# Patient Record
Sex: Male | Born: 1990 | Race: Black or African American | Hispanic: No | Marital: Single | Smoking: Never smoker
Health system: Southern US, Community
[De-identification: ages and names within clinical notes are randomized; demographics above are authoritative.]

## PROBLEM LIST (undated history)

## (undated) DIAGNOSIS — R569 Unspecified convulsions: Secondary | ICD-10-CM

## (undated) HISTORY — DX: Unspecified convulsions: R56.9

## (undated) HISTORY — PX: CIRCUMCISION: SUR203

---
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
## 2009-01-26 ENCOUNTER — Encounter: Admission: RE | Admit: 2009-01-26 | Discharge: 2009-03-22 | Payer: Self-pay | Admitting: Family Medicine

## 2009-02-21 ENCOUNTER — Emergency Department (HOSPITAL_COMMUNITY): Admission: EM | Admit: 2009-02-21 | Discharge: 2009-02-21 | Payer: Self-pay | Admitting: Emergency Medicine

## 2009-12-08 DIAGNOSIS — G40909 Epilepsy, unspecified, not intractable, without status epilepticus: Secondary | ICD-10-CM | POA: Insufficient documentation

## 2010-07-13 LAB — VALPROIC ACID LEVEL: Valproic Acid Lvl: 93.3 ug/mL (ref 50.0–100.0)
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
## 2010-08-26 NOTE — H&P (Signed)
[place]  Patient:    Casey Bryant Visit Number: [id number] MRN: [id number]          Service Type: PED Location: PEDS [id number] Attending Physician:  Mick Sell Dictated by:   Deanna Artis. Sharene Skeans, M.D. Admit Date:  04/30/2001   CC:         Casey Bryant, M.D.   History and Physical  DATE OF BIRTH: Aug 25, 1990  CHIEF COMPLAINT: Casey Bryant is a 20 year old right-handed African-American male, with history of undifferentiated autism and mental retardation, and a history of complex partial seizures with secondary generalization, that had been in very good control.  HISTORY OF PRESENT ILLNESS: The patient has had infrequent seizures over the last four years.  The last one that I am aware of was in April 2002.  His last clinic visit was in September 2002.  The patient had gained 17.7 pounds between December 2000 and May 2002. Somehow he lost 17 pounds over a four month period, from Aug 15, 2000 to January 04, 2001.  Even if he was misweighed, it was very clear that his weight was under some stable control and that our plans to switch him from Depakote to Lamictal were not necessary.  Today, Casey Bryant was witnessed to have a seizure while at school.  He was about to eat lunch when the seizure began.  He fell sideways out of the chair and struck the floor with his head on the right side.  It was generalized tonoclonic jerking that lasted for two minutes.  He bit his lip and in the aftermath was confused.  He crawled rather than walked.  He was brought to [name], where he became combative and was sedated with 2 mg of Ativan, in part to decrease the level of his agitation and activity.  Evaluation of the patient showed a valproic acid level of less than 0.1 mcg/ml.  His last known drug level in 1998 at 75 mcg/ml.  Casey Bryant is difficult from a perspective of blood drawing because of his size, his  inability to understand why things are being done to him, and his tendency to resist being held down, as well as the painful stimuli.  His caretaker, his foster mother, says that he has been getting his medication on a regular basis.  That leaves either noncompliance with medical regimen or a laboratory error as the principal causes for what appears to be a negligible level of Depakote.  The drug level has been repeated by the laboratory and remains the same, suggesting noncompliance.  PAST MEDICAL HISTORY: As noted above.  I do not recall when the patient had onset of his seizures.  The summary note from March 25, 1997 has not been sent from my office.  The patient had an EEG June 08, 1998 when he was [age] years of age.  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  No seizure activity was seen in the record.  REVIEW OF SYSTEMS: The patient has been physically well, without significant intercurrent infections in the head, neck, lungs, GI, GU.  No rash, anemia, bruisability, diabetes, or thyroid disease.  No new neurologic or neuropsychiatric problems.  Review Of Systems otherwise negative.  PAST SURGICAL HISTORY: None.  CURRENT MEDICATIONS: Depakote 125 mg sprinkles, one p.o. b.i.d.  ALLERGIES: None known.  BIRTH HISTORY: The patient was born at [redacted] weeks gestational age, weighing 2 pounds 16 ounces, and  was in intensive care for about six weeks, on a ventilator for two weeks, and sent home on an apnea bradycardia monitor for four months.  EEG August 1997 was nonspecifically slow.  He had a genetic study for fragile X, negative.  I do not believe that he had seizures in the nursery and believe that likely onset of his seizures was sometime near August 1997 when he had his EEG.  The patient has basically been on Depakote sprinkles in increasing doses since he was young.  Major side effect was obesity but, as I  mentioned, that seems to have improved.  FAMILY HISTORY: The patients brother has autism without seizures.  SOCIAL HISTORY: The patient attends [name].  He is in a foster home at this time.  He was living with his aunt, but I think that that arrangement has changed.  His father lives in town, I do not know how often he sees Casey Bryant.  PHYSICAL EXAMINATION:  GENERAL: On examination today this is a postictal child, sitting on the edge of his stretcher, being held by his caretaker.  VITAL SIGNS: Temperature 97.9 degrees axillary, blood pressure 129/71 resting, pulse 94, respirations 22.  O2 saturation 99% on room air.  HEENT: No signs of infection in tympanic membranes, pharynx, or conjunctivae.  NECK: Supple.  No bruits.  LUNGS: Clear.  HEART: No murmurs.  Pulses normal.  ABDOMEN: Soft, protuberant.  Bowel sounds normal.  No hepatosplenomegaly.  EXTREMITIES: No edema or cyanosis, or altered tone.  NEUROLOGIC: Mental status, autistic, retarded, groaning; unable to follow commands.  Cranial nerves - round, reactive pupils.  Fundi show positive red reflex.  Patient does not fix or follow, nor does he provide eye contact.  He has what appears to be symmetric facial strength and midline tongue.  Motor examination shows basically normal strength.  This is a powerfully built young man.  I cannot easily assess his fine motor movements.  Sensory examination, withdrawal x4.  Gait is ataxic but I cannot easily assess because of his postictal state and the use of Ativan to help decrease his extreme agitation from postictal confusion.  Deep tendon reflexes are absent.  He had bilateral flexor and plantar responses.  IMPRESSION:  1. Breakthrough seizures, secondarily generalized, 345.10.  2. Absent drug level, which suggests problems of noncompliance.  3. Autistic child, with no one to stay with him.  PLAN:  1. IV Depacon 500 mg for three doses at six hour intervals -  1600, 2200, and      0400 hours.  Check level in the morning and change to five sprinkles p.o.     b.i.d.  2. One-to-one nursing tonight so that he can be supervised and will not need     to be restrained.  LABORATORY DATA: Other review of his laboratories shows sodium of 140, potassium 3.8, chloride 108, CO2 20, creatinine 12, BUN 1.0, glucose 124. Hematocrit 45.  Valproic acid level was less than 0.1 mcg/ml.  I appreciate the opportunity to see Casey Bryant and will continue to follow him during the hospitalization.  I hope that the patient remains seizure-free and that drug levels will come up so that he can be discharged in the morning. Dictated by:   Deanna Artis. Sharene Skeans, M.D. Attending Physician:  Mick Sell DD:  04/30/01 TD:  05/01/01 Job: [id number]

## 2010-12-16 DIAGNOSIS — Z79899 Other long term (current) drug therapy: Secondary | ICD-10-CM | POA: Insufficient documentation

## 2011-01-03 LAB — CBC
Hemoglobin: 14.6
Platelets: 139 — ABNORMAL LOW
RDW: 12.9
WBC: 8.5

## 2011-01-03 LAB — POCT I-STAT, CHEM 8
BUN: 17
Calcium, Ion: 1.16
Chloride: 105
Hemoglobin: 15
Potassium: 3.7
Sodium: 141
TCO2: 26

## 2011-01-03 LAB — URINALYSIS, ROUTINE W REFLEX MICROSCOPIC
Hgb urine dipstick: NEGATIVE
Ketones, ur: NEGATIVE
Nitrite: NEGATIVE
Urobilinogen, UA: 1

## 2011-01-03 LAB — DIFFERENTIAL
Eosinophils Relative: 0
Lymphs Abs: 2
Monocytes Absolute: 0.6
Neutro Abs: 6
Neutrophils Relative %: 70

## 2011-01-03 LAB — URINE MICROSCOPIC-ADD ON

## 2011-01-05 LAB — POCT I-STAT 3, VENOUS BLOOD GAS (G3P V)
Acid-Base Excess: 4 — ABNORMAL HIGH
Bicarbonate: 27.1 — ABNORMAL HIGH
O2 Saturation: 94
Operator id: 272551
TCO2: 28
pH, Ven: 7.493 — ABNORMAL HIGH
pO2, Ven: 65 — ABNORMAL HIGH

## 2011-01-05 LAB — VALPROIC ACID LEVEL: Valproic Acid Lvl: 106.1 — ABNORMAL HIGH

## 2011-01-05 LAB — COMPREHENSIVE METABOLIC PANEL
Albumin: 3.7
GFR calc Af Amer: >60
GFR calc non Af Amer: >60
Potassium: 3.6
Sodium: 137
Total Bilirubin: 1.2

## 2011-01-05 LAB — DIFFERENTIAL
Basophils Relative: 0
Eosinophils Absolute: 0
Lymphocytes Relative: 8 — ABNORMAL LOW
Neutrophils Relative %: 92 — ABNORMAL HIGH

## 2011-01-05 LAB — CBC
HCT: 46.6
Hemoglobin: 16.2
MCV: 91.8
RBC: 5.08
WBC: 18.8 — ABNORMAL HIGH

## 2011-07-17 DIAGNOSIS — B079 Viral wart, unspecified: Secondary | ICD-10-CM | POA: Diagnosis not present

## 2011-08-31 DIAGNOSIS — Z79899 Other long term (current) drug therapy: Secondary | ICD-10-CM | POA: Diagnosis not present

## 2011-08-31 DIAGNOSIS — G4700 Insomnia, unspecified: Secondary | ICD-10-CM | POA: Diagnosis not present

## 2011-08-31 DIAGNOSIS — G40309 Generalized idiopathic epilepsy and epileptic syndromes, not intractable, without status epilepticus: Secondary | ICD-10-CM | POA: Diagnosis not present

## 2011-08-31 DIAGNOSIS — F840 Autistic disorder: Secondary | ICD-10-CM | POA: Diagnosis not present

## 2012-07-23 DIAGNOSIS — Z833 Family history of diabetes mellitus: Secondary | ICD-10-CM | POA: Insufficient documentation

## 2012-07-23 DIAGNOSIS — Z79899 Other long term (current) drug therapy: Secondary | ICD-10-CM | POA: Diagnosis not present

## 2012-07-23 DIAGNOSIS — F8189 Other developmental disorders of scholastic skills: Secondary | ICD-10-CM | POA: Diagnosis not present

## 2012-07-23 DIAGNOSIS — G40909 Epilepsy, unspecified, not intractable, without status epilepticus: Secondary | ICD-10-CM | POA: Diagnosis not present

## 2012-08-08 ENCOUNTER — Encounter: Payer: Self-pay | Admitting: Family

## 2012-08-08 ENCOUNTER — Ambulatory Visit (INDEPENDENT_AMBULATORY_CARE_PROVIDER_SITE_OTHER): Payer: Medicare Other | Admitting: Family

## 2012-08-08 VITALS — BP 128/70 | HR 78 | Ht 66.0 in | Wt 167.4 lb

## 2012-08-08 DIAGNOSIS — G4700 Insomnia, unspecified: Secondary | ICD-10-CM

## 2012-08-08 DIAGNOSIS — G40309 Generalized idiopathic epilepsy and epileptic syndromes, not intractable, without status epilepticus: Secondary | ICD-10-CM

## 2012-08-08 DIAGNOSIS — Z79899 Other long term (current) drug therapy: Secondary | ICD-10-CM

## 2012-08-08 DIAGNOSIS — F840 Autistic disorder: Secondary | ICD-10-CM

## 2012-08-08 MED ORDER — LEVETIRACETAM 100 MG/ML PO SOLN: ORAL | Status: DC

## 2012-08-08 MED ORDER — LAMOTRIGINE 100 MG PO TABS: ORAL_TABLET | ORAL | Status: DC

## 2012-08-08 MED ORDER — DEPAKOTE SPRINKLES 125 MG PO CPSP: ORAL_CAPSULE | ORAL | Status: DC

## 2012-08-08 NOTE — Progress Notes (Signed)
Patient: Casey Bryant MRN: [id number] Sex: male DOB: Jan 08, 1991  Provider: Elveria Rising, NP Location of Care: [facility]  Note type: Routine return visit  History of Present Illness: Referral Source: [facility] History from: foster moster Chief Complaint: Seizure Disorder and Autism  Casey Bryant is a 22 y.o. male with history of autism and seizure disorder. He is taking and tolerating Lamotrogine, Depakote and Levetiracetam. His last seizure occurred in November, 2010.  Mom reports that he has been healthy since last seen. He has had problems with poor appetite in the past but that has resolved. He has had problems with insomnia but that has also improved. He is enrolled in a day program called "[employer]". There are no problems with behavior.   Review of Systems: 12 system review was unremarkable  Past Medical History  Diagnosis Date  . Seizures    Hospitalizations: no, Head Injury: no, Nervous System Infections: no, Immunizations up to date: yes Past Medical History Comments: autism and seizures. He has a twin brother who also has autism.   Behavior History attention difficulties and poor social interaction  Surgical History No past surgical history on file. Surgeries: no  Family History family history includes Cancer in his mother. Family History is negative migraines, seizures, cognitive impairment, blindness, deafness, birth defects, chromosomal disorder, autism.  Social History History   Social History  . Marital Status: Single    Spouse Name: N/A    Number of Children: N/A  . Years of Education: N/A   Social History Main Topics  . Smoking status: Never Smoker   . Smokeless tobacco: Not on file  . Alcohol Use: No  . Drug Use: No  . Sexually Active: Not on file   Other Topics Concern  . Not on file   Social History Narrative  . No narrative on file   Educational level  School Attending: N/A   Occupation:  N/A Living with Malen Gauze parents and twin brother.   No current outpatient prescriptions on file prior to visit.   No current facility-administered medications on file prior to visit.   The medication list was reviewed and reconciled. All changes or newly prescribed medications were explained.  A complete medication list was provided to the patient/caregiver.  No Known Allergies  Physical Exam BP 128/70  Pulse 78  Ht 5\' 6"  (1.676 m)  Wt 167 lb 6.4 oz (75.932 kg)  BMI 27.03 kg/m2 Mental Status: Awake and fully alert. He has no language. He responded appropriately to some basic commands and tolerated invasion into his space fairly well. He smiled responsively at times. Cranial Nerves: Fundoscopic exam reveals red reflex. Unable to fully visual fundus due to lack of patient's cooperation. Pupils equal, briskly reactive to light.  Extraocular movements appear full. Tracks objects in his visual field. Turns to localize sounds. Neck flexion and extension normal. Motor: Unable to fully assess but appears to have normal bulk, tone and strength. Sensory: Withdrawal x 4. Coordination: Unable to fully assess but transferred a toy from hand to hand and demonstrated fairly good fine motor movements with his hands.  Gait and Station: Arises from chair without difficulty.  Stance is wide based.  He walked easily but was hesitant to walk without person assistance leading him to do so.  Reflexes: Unable to fully assess but DTR's appear to be absent bilaterally.   Assessment and Plan Casey Bryant is a 22 year old young young man with history of autism and  seizure disorder. His last seizure occurred in November 2010 and he is tolerating his medications well. I will change his Lamotrigine from 25mg  4 tablets twice per day to Lamotrigine 100mg  1 tablet twice per day to simplify administration. He will otherwise continue his medications without change and will return for follow up in one year or sooner if needed.

## 2012-08-08 NOTE — Patient Instructions (Addendum)
Change Lamotrigine 25mg  4 tablets twice per day to Lamotrigine 100mg  1 tablet twice per day.  Continue Depakote Sprinkles and Levetiracetam without change.  Call me if Casey Bryant has seizures or if you have other concerns.  Plan to return for follow up in 1 year or sooner if needed.

## 2012-08-15 ENCOUNTER — Other Ambulatory Visit: Payer: Self-pay

## 2012-08-15 DIAGNOSIS — G40309 Generalized idiopathic epilepsy and epileptic syndromes, not intractable, without status epilepticus: Secondary | ICD-10-CM

## 2012-08-15 MED ORDER — LEVETIRACETAM 100 MG/ML PO SOLN: ORAL | Status: DC

## 2012-08-15 NOTE — Telephone Encounter (Signed)
I called [facility] and they said they never received this Rx but did get the others you sent on the same pt.

## 2012-10-17 ENCOUNTER — Other Ambulatory Visit: Payer: Self-pay

## 2012-10-17 DIAGNOSIS — G40309 Generalized idiopathic epilepsy and epileptic syndromes, not intractable, without status epilepticus: Secondary | ICD-10-CM

## 2012-10-17 MED ORDER — DEPAKOTE SPRINKLES 125 MG PO CPSP: ORAL_CAPSULE | ORAL | Status: DC

## 2012-11-11 ENCOUNTER — Other Ambulatory Visit: Payer: Self-pay

## 2012-11-11 DIAGNOSIS — G40309 Generalized idiopathic epilepsy and epileptic syndromes, not intractable, without status epilepticus: Secondary | ICD-10-CM

## 2012-11-11 MED ORDER — LAMOTRIGINE 100 MG PO TABS: ORAL_TABLET | ORAL | Status: DC

## 2013-03-27 ENCOUNTER — Encounter (HOSPITAL_COMMUNITY): Payer: Self-pay | Admitting: Emergency Medicine

## 2013-03-27 ENCOUNTER — Emergency Department (HOSPITAL_COMMUNITY)
Admission: EM | Admit: 2013-03-27 | Discharge: 2013-03-27 | Disposition: A | Discharge status: Home / Self Care | Payer: Medicare Other | Attending: Emergency Medicine | Admitting: Emergency Medicine

## 2013-03-27 DIAGNOSIS — G40909 Epilepsy, unspecified, not intractable, without status epilepticus: Secondary | ICD-10-CM | POA: Diagnosis not present

## 2013-03-27 DIAGNOSIS — Y9389 Activity, other specified: Secondary | ICD-10-CM | POA: Insufficient documentation

## 2013-03-27 DIAGNOSIS — T426X1A Poisoning by other antiepileptic and sedative-hypnotic drugs, accidental (unintentional), initial encounter: Secondary | ICD-10-CM | POA: Insufficient documentation

## 2013-03-27 DIAGNOSIS — F840 Autistic disorder: Secondary | ICD-10-CM | POA: Diagnosis not present

## 2013-03-27 DIAGNOSIS — Z79899 Other long term (current) drug therapy: Secondary | ICD-10-CM | POA: Insufficient documentation

## 2013-03-27 DIAGNOSIS — T50901A Poisoning by unspecified drugs, medicaments and biological substances, accidental (unintentional), initial encounter: Secondary | ICD-10-CM | POA: Diagnosis not present

## 2013-03-27 DIAGNOSIS — Y9289 Other specified places as the place of occurrence of the external cause: Secondary | ICD-10-CM | POA: Insufficient documentation
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
## 2013-03-27 NOTE — ED Provider Notes (Signed)
CSN: [id number]     Arrival date & time 03/27/13  1342 History   First MD Initiated Contact with Patient 03/27/13 1620     Chief Complaint  Patient presents with  . Drug Overdose   (Consider location/radiation/quality/duration/timing/severity/associated sxs/prior Treatment) HPI Comments: Level V caveat due to 2 autism. Patient also has a known seizure disorder. He used to be on Lamictal 25 mg tablets and he was instructed on his MAR to be given 4 tablets in the morning and in the evening for a total dosage of 200 mg per day. He apparently received a new prescription of 100 mg tablets, and caregivers were still giving him 4 tablets in the morning and in the evening since Sunday. None of the lamictal was given to him today. Yesterday at his activity center, it was noted that the patient was acting drowsy and "drunk." He is acting more like his baseline today coincide and with holding his medications. He also had 2 episodes of nausea and vomiting early this morning at 2 AM, however the patient has subsequently eaten some breakfast and lunch without apparently any difficulty.  Patient is a 22 y.o. male presenting with Overdose. The history is provided by a caregiver. The history is limited by a developmental delay.  Drug Overdose    Past Medical History  Diagnosis Date  . Seizures    History reviewed. No pertinent past surgical history. Family History  Problem Relation Age of Onset  . Cancer Mother     Died in 22-Aug-2002   History  Substance Use Topics  . Smoking status: Never Smoker   . Smokeless tobacco: Not on file  . Alcohol Use: No    Review of Systems  Unable to perform ROS: Other    Allergies  Review of patient's allergies indicates no known allergies.  Home Medications   Current Outpatient Rx  Name  Route  Sig  Dispense  Refill  . DEPAKOTE SPRINKLES 125 MG capsule      Take 5 sprinkles in the morning and 6 in the evening (BMN)   333 capsule   11     Dispense as  written.    Brand Name Medically Necessary   . lamoTRIgine (LAMICTAL) 100 MG tablet      Take 1 in the morning and 1 at night   60 tablet   8   . levETIRAcetam (KEPPRA) 100 MG/ML solution      Take 7.5 cc in the morning and 7.5 cc in the evening   475 mL   11   . Multiple Vitamins-Minerals (MULTIVITAMIN WITH MINERALS) tablet   Oral   Take 1 tablet by mouth daily.          BP 122/86  Pulse 95  Resp 18  Wt 184 lb 2 oz (83.519 kg)  SpO2 97% Physical Exam  Nursing note and vitals reviewed. Constitutional: He appears well-developed and well-nourished. No distress.  HENT:  Head: Normocephalic and atraumatic.  Cardiovascular: Intact distal pulses.   Pulmonary/Chest: Effort normal. No respiratory distress.  Abdominal: Soft. He exhibits no distension. There is no tenderness.  Neurological: He is alert. He displays no tremor. He exhibits normal muscle tone.  Awake, interactive, looks arounnd puts head down to look at floor when I address him, but then will look up again afterwards.  Skin: Skin is warm. He is not diaphoretic.  Psychiatric:  Normal behavior for his baseline per caregiver    ED Course  Procedures (including critical care time) Labs  Review Labs Reviewed - No data to display Imaging Review No results found.  EKG Interpretation    Date/Time:  Thursday March 27 2013 17:11:08 EST Ventricular Rate:  96 PR Interval:  133 QRS Duration: 85 QT Interval:  345 QTC Calculation: 436 R Axis:   49 Text Interpretation:  Sinus rhythm Borderline T wave abnormalities Borderline ECG Confirmed by [facility]  MD, MICHEAL ([id number]) on 03/27/2013 5:13:44 PM           RA sat is 97% and I interpret to be adequate  5:27 PM Spoke to poison control in [place] (covering for [place] currently) and no specific testing is needed, confirms that half life is rapid, peak levels are past already and I suspect pt is fine to go home, resume meds tomorrow.      MDM   1. Accidental overdose,  initial encounter     Pt is at baseline per caregiver.  She had spoken to poison control and was instructed to come to the ED.  Pt seems shy, but is behaving like he normally would around strangers.     Will discuss with Poison control and follow recommendations.  I spoke to pharmacy who reports short half life, should be cleared from system by tomorrow and resuming correct dose tomorrow would be appropriate.  Pt's improvement in symptoms is consistent with stopping that medicine today.        Gavin Pound. Oletta Lamas, MD 03/27/13 1610

## 2013-03-27 NOTE — Discharge Instructions (Signed)
 Accidental Overdose A drug overdose occurs when a chemical substance (drug or medication) is used in amounts large enough to overcome a person. This may result in severe illness or death. This is a type of poisoning. Accidental overdoses of medications or other substances come from a variety of reasons. When this happens accidentally, it is often because the person taking the substance does not know enough about what they have taken. Drugs which commonly cause overdose deaths are alcohol, psychotropic medications (medications which affect the mind), pain medications, illegal drugs (street drugs) such as cocaine and heroin, and multiple drugs taken at the same time. It may result from careless behavior (such as over-indulging at a party). Other causes of overdose may include multiple drug use, a lapse in memory, or drug use after a period of no drug use.  Sometimes overdosing occurs because a person cannot remember if they have taken their medication.  A common unintentional overdose in young children involves multi-vitamins containing iron. Iron is a part of the hemoglobin molecule in blood. It is used to transport oxygen to living cells. When taken in small amounts, iron allows the body to restock hemoglobin. In large amounts, it causes problems in the body. If this overdose is not treated, it can lead to death. Never take medicines that show signs of tampering or do not seem quite right. Never take medicines in the dark or in poor lighting. Read the label and check each dose of medicine before you take it. When adults are poisoned, it happens most often through carelessness or lack of information. Taking medicines in the dark or taking medicine prescribed for someone else to treat the same type of problem is a dangerous practice. SYMPTOMS  Symptoms of overdose depend on the medication and amount taken. They can vary from over-activity with stimulant over-dosage, to sleepiness from depressants such as  alcohol, narcotics and tranquilizers. Confusion, dizziness, nausea and vomiting may be present. If problems are severe enough coma and death may result. DIAGNOSIS  Diagnosis and management are generally straightforward if the drug is known. Otherwise it is more difficult. At times, certain symptoms and signs exhibited by the patient, or blood tests, can reveal the drug in question.  TREATMENT  In an emergency department, most patients can be treated with supportive measures. Antidotes may be available if there has been an overdose of opioids or benzodiazepines. A rapid improvement will often occur if this is the cause of overdose. At home or away from medical care:  There may be no immediate problems or warning signs in children.  Not everything works well in all cases of poisoning.  Take immediate action. Poisons may act quickly.  If you think someone has swallowed medicine or a household product, and the person is unconscious, having seizures (convulsions), or is not breathing, immediately call for an ambulance. IF a person is conscious and appears to be doing OK but has swallowed a poison:  Do not wait to see what effect the poison will have. Immediately call a poison control center (listed in the white pages of your telephone book under Poison Control or inside the front cover with other emergency numbers). Some poison control centers have TTY capability for the deaf. Check with your local center if you or someone in your family requires this service.  Keep the container so you can read the label on the product for ingredients.  Describe what, when, and how much was taken and the age and condition of the person  poisoned. Inform them if the person is vomiting, choking, drowsy, shows a change in color or temperature of skin, is conscious or unconscious, or is convulsing.  Do not cause vomiting unless instructed by medical personnel. Do not induce vomiting or force liquids into a person who  is convulsing, unconscious, or very drowsy. Stay calm and in control.   Activated charcoal also is sometimes used in certain types of poisoning and you may wish to add a supply to your emergency medicines. It is available without a prescription. Call a poison control center before using this medication. PREVENTION  Thousands of children die every year from unintentional poisoning. This may be from household chemicals, poisoning from carbon monoxide in a car, taking their parent's medications, or simply taking a few iron pills or vitamins with iron. Poisoning comes from unexpected sources.  Store medicines out of the sight and reach of children, preferably in a locked cabinet. Do not keep medications in a food cabinet. Always store your medicines in a secure place. Get rid of expired medications.  If you have children living with you or have them as occasional guests, you should have child-resistant caps on your medicine containers. Keep everything out of reach. Child proof your home.  If you are called to the telephone or to answer the door while you are taking a medicine, take the container with you or put the medicine out of the reach of small children.  Do not take your medication in front of children. Do not tell your child how good a medication is and how good it is for them. They may get the idea it is more of a treat.  If you are an adult and have accidentally taken an overdose, you need to consider how this happened and what can be done to prevent it from happening again. If this was from a street drug or alcohol, determine if there is a problem that needs addressing. If you are not sure a problems exists, it is easy to talk to a professional and ask them if they think you have a problem. It is better to handle this problem in this way before it happens again and has a much worse consequence. Document Released: 06/10/2004 Document Revised: 06/19/2011 Document Reviewed: 11/16/2008 [employer]  Patient Information 2014 [employer]   YOU MAY RESUME THE CORRECT DOSE OF LAMICTAL  100 MG BID STARTING TOMORROW 03/28/13

## 2013-03-27 NOTE — ED Notes (Signed)
Per pt advocate sts that pt has been receiving triple of his psych medication since Sunday. sts since yesterday pt has been acting drunk and not eating.

## 2013-04-29 DIAGNOSIS — Z23 Encounter for immunization: Secondary | ICD-10-CM | POA: Diagnosis not present

## 2013-08-18 ENCOUNTER — Other Ambulatory Visit: Payer: Self-pay | Admitting: Family

## 2013-08-27 ENCOUNTER — Ambulatory Visit: Payer: Self-pay | Admitting: Family

## 2013-09-03 ENCOUNTER — Ambulatory Visit (INDEPENDENT_AMBULATORY_CARE_PROVIDER_SITE_OTHER): Payer: Medicare Other | Admitting: Family

## 2013-09-03 ENCOUNTER — Encounter: Payer: Self-pay | Admitting: Family

## 2013-09-03 VITALS — BP 130/72 | HR 80 | Ht 65.25 in | Wt 186.2 lb

## 2013-09-03 DIAGNOSIS — Z79899 Other long term (current) drug therapy: Secondary | ICD-10-CM

## 2013-09-03 DIAGNOSIS — R635 Abnormal weight gain: Secondary | ICD-10-CM

## 2013-09-03 DIAGNOSIS — G40309 Generalized idiopathic epilepsy and epileptic syndromes, not intractable, without status epilepticus: Secondary | ICD-10-CM

## 2013-09-03 DIAGNOSIS — G4700 Insomnia, unspecified: Secondary | ICD-10-CM

## 2013-09-03 DIAGNOSIS — F840 Autistic disorder: Secondary | ICD-10-CM | POA: Diagnosis not present

## 2013-09-03 MED ORDER — DEPAKOTE SPRINKLES 125 MG PO CPSP: ORAL_CAPSULE | ORAL | Status: DC

## 2013-09-03 MED ORDER — LAMOTRIGINE 100 MG PO TABS: ORAL_TABLET | ORAL | Status: DC

## 2013-09-03 MED ORDER — LEVETIRACETAM 100 MG/ML PO SOLN: ORAL | Status: DC

## 2013-09-03 NOTE — Patient Instructions (Signed)
Continue Casey Bryant's medications without change. Let me know if he has any seizures. I have refilled his medications.  Continue to monitor his intake and snacks. He has gained weight since last year. It would be good for him to lose some weight, as he is considered obese, based on his BMI. Today his BMI is 30, and he should be closer to 25. His BMI last year was 27, when he weighed 167 lbs. I have printed his last 3 weights that we have on file for you to see.  Wt Readings from Last 3 Encounters:  09/03/13 186 lb 3.2 oz (84.46 kg)  03/27/13 184 lb 2 oz (83.519 kg)  08/08/12 167 lb 6.4 oz (75.932 kg)   Please plan to return for follow up in 1 year or sooner if needed.

## 2013-09-03 NOTE — Progress Notes (Signed)
Patient: Casey Bryant MRN: [id number] Sex: male DOB: Sep 09, 1990  Provider: Elveria Rising, NP Location of Care: [facility]  Note type: Routine return visit  History of Present Illness: Referral Source: [facility] History from: foster mother Chief Complaint: Seizures/Autism  Casey Bryant is a 23 y.o. young man with history of autism and seizure disorder. He is taking and tolerating Lamotrogine, Depakote and Levetiracetam. His last seizure occurred in November, 2010. Mom reports that he has been healthy since last seen. He had problems with poor appetite in the past but that resolved. Since he was last seen in March 2015, he now appears to be overweight. Casey Bryant has had problems with insomnia in the past as well but that has also improved. He is enrolled in a day program called "[employer]". There are no problems with behavior for the most part. His foster mother has no concerns today.  Review of Systems: 12 system review was unremarkable  Past Medical History  Diagnosis Date  . Seizures    Hospitalizations: no, Head Injury: no, Nervous System Infections: no, Immunizations up to date: yes Past Medical History Comments: He has a twin brother with autism.  Surgical History History reviewed. No pertinent past surgical history.  Family History family history includes Cancer in his mother. He has a twin brother with autism. Family History is otherwise negative for migraines, seizures, cognitive impairment, blindness, deafness, birth defects, chromosomal disorder.  Social History History   Social History  . Marital Status: Single    Spouse Name: N/A    Number of Children: N/A  . Years of Education: N/A   Social History Main Topics  . Smoking status: Never Smoker   . Smokeless tobacco: Never Used  . Alcohol Use: No  . Drug Use: No  . Sexual Activity: No   Other Topics Concern  . None   Social History Narrative  . None    Educational level: special education  School Attending:[employer] Living with:  Casey Bryant provider  Hobbies/Interest: walking in the park, watching TV and listening to the radio. School comments:  [name] is doing well in his day program.  Physical Exam BP 130/72  Pulse 80  Ht 5' 5.25" (1.657 m)  Wt 186 lb 3.2 oz (84.46 kg)  BMI 30.76 kg/m2 General: well developed, well nourished male, seated, in no evident distress. He had intermittent flapping motions of his hands. Head: head normocephalic and atraumatic.   Ears, Nose and Throat: oropharynx benign Neck: supple with no carotid or supraclavicular bruits. Respiratory: lungs clear to auscultation Cardiovascular: regular rate and rhythm, no murmurs Musculoskeletal: no obvious deformities or scoliosis Skin: no rashes Trunk: normal alignment and mobility, no deformity  Neurologic Exam  Mental Status: Awake and fully alert. He has no language. He responded appropriately to some basic commands and tolerated invasion into his space fairly well. He smiled responsively at times. Cranial Nerves: Fundoscopic exam reveals red reflex. Unable to fully visual fundus due to lack of patient's cooperation. Pupils equal, briskly reactive to light.  Extraocular movements appear full. Tracks objects in his visual field. Turns to localize sounds. Neck flexion and extension normal. Motor: Unable to fully assess but appears to have normal bulk, tone and strength. Sensory: Withdrawal x 4. Coordination: Unable to fully assess but transferred a toy from hand to hand and demonstrated fairly good fine motor movements with his hands.  Gait and Station: Arises from chair without difficulty.  Stance is  wide based.  He walked easily but was hesitant to walk without person assistance leading him to do so.  Reflexes: Unable to fully assess but DTR's appear to be absent bilaterally.   Assessment and Plan Casey Bryant is a 23 year old young man with  history of autism and seizure disorder. He is taking and tolerating Lamotrigine, Levetiracetam and Depakote. His last seizure occurred in November 2010. Casey Bryant has gained almost 20 lbs since his last visit and I talked with his mother about this. I am concerned that with this type of weight gain that he is at risk for conditions related to obesity such as diabetes and hypertension. I talked with her about limiting portion sizes and snacks, offering healthy foods, and limiting foods with empty calories such as soft drinks. Casey Bryant can be picky, and I do not want him to abruptly begin to refuse foods because he doesn't like what is offered. His foster mother agreed and will make small changes that she knows that he will tolerate with his history of autism. He will otherwise continue his medications without change and will return for follow up in one year or sooner if needed.

## 2013-09-05 ENCOUNTER — Encounter: Payer: Self-pay | Admitting: Family

## 2013-09-05 DIAGNOSIS — E663 Overweight: Secondary | ICD-10-CM | POA: Insufficient documentation

## 2013-09-21 ENCOUNTER — Other Ambulatory Visit: Payer: Self-pay | Admitting: Family

## 2013-12-17 DIAGNOSIS — G40909 Epilepsy, unspecified, not intractable, without status epilepticus: Secondary | ICD-10-CM | POA: Diagnosis not present

## 2013-12-17 DIAGNOSIS — F840 Autistic disorder: Secondary | ICD-10-CM | POA: Diagnosis not present

## 2013-12-17 DIAGNOSIS — H6120 Impacted cerumen, unspecified ear: Secondary | ICD-10-CM | POA: Diagnosis not present

## 2013-12-17 DIAGNOSIS — Z79899 Other long term (current) drug therapy: Secondary | ICD-10-CM | POA: Diagnosis not present

## 2014-03-14 ENCOUNTER — Other Ambulatory Visit: Payer: Self-pay | Admitting: Family

## 2014-04-12 ENCOUNTER — Other Ambulatory Visit: Payer: Self-pay | Admitting: Family

## 2014-04-13 ENCOUNTER — Other Ambulatory Visit: Payer: Self-pay | Admitting: Family

## 2014-04-13 MED ORDER — DEPAKOTE SPRINKLES 125 MG PO CPSP: ORAL_CAPSULE | ORAL | Status: DC

## 2014-04-13 NOTE — Telephone Encounter (Signed)
Previous Rx did not print. TG 

## 2014-04-18 ENCOUNTER — Other Ambulatory Visit: Payer: Self-pay | Admitting: Family

## 2014-04-22 ENCOUNTER — Other Ambulatory Visit: Payer: Self-pay | Admitting: Family

## 2014-04-26 ENCOUNTER — Emergency Department (HOSPITAL_COMMUNITY): Payer: Medicare Other

## 2014-04-26 ENCOUNTER — Emergency Department (HOSPITAL_COMMUNITY)
Admission: EM | Admit: 2014-04-26 | Discharge: 2014-04-26 | Disposition: A | Discharge status: Home / Self Care | Payer: Medicare Other | Attending: Emergency Medicine | Admitting: Emergency Medicine

## 2014-04-26 ENCOUNTER — Encounter (HOSPITAL_COMMUNITY): Payer: Self-pay | Admitting: *Deleted

## 2014-04-26 DIAGNOSIS — M25511 Pain in right shoulder: Secondary | ICD-10-CM | POA: Diagnosis not present

## 2014-04-26 DIAGNOSIS — G40909 Epilepsy, unspecified, not intractable, without status epilepticus: Secondary | ICD-10-CM | POA: Insufficient documentation

## 2014-04-26 DIAGNOSIS — S4991XA Unspecified injury of right shoulder and upper arm, initial encounter: Secondary | ICD-10-CM | POA: Diagnosis not present

## 2014-04-26 DIAGNOSIS — R569 Unspecified convulsions: Secondary | ICD-10-CM | POA: Diagnosis present

## 2014-04-26 DIAGNOSIS — Z79899 Other long term (current) drug therapy: Secondary | ICD-10-CM | POA: Diagnosis not present

## 2014-04-26 LAB — CBC WITH DIFFERENTIAL/PLATELET
Basophils Absolute: 0 10*3/uL (ref 0.0–0.1)
Basophils Relative: 0 % (ref 0–1)
Eosinophils Absolute: 0 10*3/uL (ref 0.0–0.7)
Eosinophils Relative: 0 % (ref 0–5)
HCT: 43.1 % (ref 39.0–52.0)
Hemoglobin: 14.9 g/dL (ref 13.0–17.0)
LYMPHS ABS: 1.4 10*3/uL (ref 0.7–4.0)
LYMPHS PCT: 11 % — AB (ref 12–46)
MCH: 31 pg (ref 26.0–34.0)
MCHC: 34.6 g/dL (ref 30.0–36.0)
MCV: 89.8 fL (ref 78.0–100.0)
MONOS PCT: 7 % (ref 3–12)
Monocytes Absolute: 0.9 10*3/uL (ref 0.1–1.0)
Neutro Abs: 10.2 10*3/uL — ABNORMAL HIGH (ref 1.7–7.7)
Neutrophils Relative %: 82 % — ABNORMAL HIGH (ref 43–77)
Platelets: 159 10*3/uL (ref 150–400)
RBC: 4.8 MIL/uL (ref 4.22–5.81)
RDW: 12.5 % (ref 11.5–15.5)
WBC: 12.5 10*3/uL — ABNORMAL HIGH (ref 4.0–10.5)

## 2014-04-26 LAB — COMPREHENSIVE METABOLIC PANEL
ALBUMIN: 4.2 g/dL (ref 3.5–5.2)
ALT: 18 U/L (ref 0–53)
AST: 47 U/L — AB (ref 0–37)
Alkaline Phosphatase: 51 U/L (ref 39–117)
Anion gap: 10 (ref 5–15)
BUN: 9 mg/dL (ref 6–23)
CALCIUM: 10.4 mg/dL (ref 8.4–10.5)
CO2: 30 mmol/L (ref 19–32)
Chloride: 103 mEq/L (ref 96–112)
Creatinine, Ser: 0.99 mg/dL (ref 0.50–1.35)
GFR calc Af Amer: >90 mL/min (ref >90)
Glucose, Bld: 110 mg/dL — ABNORMAL HIGH (ref 70–99)
Potassium: 4 mmol/L (ref 3.5–5.1)
Sodium: 143 mmol/L (ref 135–145)
TOTAL PROTEIN: 7.5 g/dL (ref 6.0–8.3)
Total Bilirubin: 0.6 mg/dL (ref 0.3–1.2)

## 2014-04-26 LAB — VALPROIC ACID LEVEL: Valproic Acid Lvl: 94 ug/mL (ref 50.0–100.0)

## 2014-04-26 MED ORDER — LEVETIRACETAM 100 MG/ML PO SOLN
1000.0000 mg | Freq: Once | ORAL | Status: AC
  Administered 2014-04-26: 1000 mg via ORAL
  Filled 2014-04-26: qty 10

## 2014-04-26 NOTE — ED Notes (Signed)
Pt family concerned about receiving keppra since he had 750 mg PTA. MD aware and spoke with family about med.

## 2014-04-26 NOTE — ED Provider Notes (Signed)
CSN: [id number]     Arrival date & time 04/26/14  1632 History   First MD Initiated Contact with Patient 04/26/14 1653     Chief Complaint  Patient presents with  . Seizures     (Consider location/radiation/quality/duration/timing/severity/associated sxs/prior Treatment) HPI 24 year old male with past medical history of autism and seizure who presents to ED with parents after having a seizure today. Mom states patient missed his dose of Keppra yesterday and this morning because he ran out. She states patient had a witnessed seizure lasting 1-1/2 minutes with generalized shaking typical of previous seizures. Mom states patient has not had a seizure for 5 years as he has been on a strict antiepileptic regimen. States she called patient's neurologist who advised she come to the ED for levels of his Dilantin checked and further evaluation. Mom states patient has not had any preceding illness and denies patient having fever, cough, nausea, vomiting, diarrhea. They state patient is acting normal now.  Past Medical History  Diagnosis Date  . Seizures    History reviewed. No pertinent past surgical history. Family History  Problem Relation Age of Onset  . Cancer Mother     Died in 2004   History  Substance Use Topics  . Smoking status: Never Smoker   . Smokeless tobacco: Never Used  . Alcohol Use: No    Review of Systems Unable to obtain 2/2 autism / MR   Allergies  Review of patient's allergies indicates no known allergies.  Home Medications   Prior to Admission medications   Medication Sig Start Date End Date Taking? Authorizing Provider  divalproex (DEPAKOTE SPRINKLE) 125 MG capsule Take 625-750 mg by mouth 2 (two) times daily. Take 5 capsules (625 mg) every morning and 6 capsules (750 mg) every night   Yes Historical Provider, MD  lamoTRIgine (LAMICTAL) 100 MG tablet TAKE 1 TABLET BY MOUTH IN THE MORNING AND 1 TABLET AT NIGHT 04/20/14  Yes Elveria Risingina Goodpasture, NP  levETIRAcetam  (KEPPRA) 100 MG/ML solution TAKE 1 & 1/2 TEASPOONFULS (7.5 ML ) BY MOUTH 2 TIMES A DAY 04/23/14  Yes Elveria Risingina Goodpasture, NP  Multiple Vitamins-Minerals (MULTIVITAMIN WITH MINERALS) tablet Take 1 tablet by mouth daily.   Yes Historical Provider, MD  DEPAKOTE SPRINKLES 125 MG capsule TAKE 5 CAPSULES EVERY MORNING AND TAKE 6 CAPSULES EVERY EVENING Patient not taking: Reported on 04/26/2014 04/20/14   Elveria Risingina Goodpasture, NP  lamoTRIgine (LAMICTAL) 100 MG tablet TAKE 1 TABLET BY MOUTH IN THE MORNING AND 1 TABLET AT NIGHT Patient not taking: Reported on 04/26/2014 03/16/14   Elveria Risingina Goodpasture, NP   BP 129/76 mmHg  Pulse 79  Temp(Src) 98.1 F (36.7 C) (Oral)  Resp 24  Wt 178 lb (80.74 kg)  SpO2 100% Physical Exam  Constitutional: He appears well-developed and well-nourished. No distress.  HENT:  Head: Normocephalic and atraumatic.  Nose: Nose normal.  Mouth/Throat: Oropharynx is clear and moist. No oropharyngeal exudate.  Eyes: Conjunctivae and EOM are normal.  Neck: Normal range of motion. Neck supple. No JVD present.  Cardiovascular: Normal rate, regular rhythm, normal heart sounds and intact distal pulses.   Pulmonary/Chest: Effort normal and breath sounds normal. No respiratory distress.  Abdominal: Soft. He exhibits no distension. There is no tenderness. There is no rebound and no guarding.  Musculoskeletal: Normal range of motion.  Neurological: He is alert. No cranial nerve deficit.  Reported to be at baseline per parents  Skin: Skin is warm and dry. No rash noted.  Psychiatric: He has a  normal mood and affect.    ED Course  Procedures (including critical care time) Labs Review Labs Reviewed  CBC WITH DIFFERENTIAL - Abnormal; Notable for the following:    WBC 12.5 (*)    Neutrophils Relative % 82 (*)    Neutro Abs 10.2 (*)    Lymphocytes Relative 11 (*)    All other components within normal limits  COMPREHENSIVE METABOLIC PANEL - Abnormal; Notable for the following:    Glucose, Bld  110 (*)    AST 47 (*)    All other components within normal limits  VALPROIC ACID LEVEL    Imaging Review Dg Shoulder Right  04/26/2014   CLINICAL DATA:  Seizure.  Fall.  Right shoulder pain.  EXAM: RIGHT SHOULDER - 2+ VIEW  COMPARISON:  None.  FINDINGS: There is no evidence of fracture or dislocation. There is no evidence of arthropathy or other focal bone abnormality. Soft tissues are unremarkable.  IMPRESSION: Negative.   Electronically Signed   By: Herbie Baltimore M.D.   On: 04/26/2014 18:52     EKG Interpretation None      MDM   Final diagnoses:  None    Patient resistance with seizure typical for prior after missed dose of Keppra. Hemodynamically stable in ED. Parents state patient acting normal now. Patient has had his morning dose of Keppra approximately 2 hours ago. We will check Dilantin level. We will also load patient with Keppra at this time. Dilantin level within normal limits. Have instructed patient is to continue their normal antiepileptic regimen as previously instructed and to follow closely with her neurologist. Shoulder x-ray is without signs of acute injury. Deemed stable for discharge.  Clinical Impression: 1. Seizure     Disposition: Discharge  Condition: Good  I have discussed the results, Dx and Tx plan with the pt(& family if present). He/she/they expressed understanding and agree(s) with the plan. Discharge instructions discussed at great length. Strict return precautions discussed and pt &/or family have verbalized understanding of the instructions. No further questions at time of discharge.    New Prescriptions   No medications on file    Follow Up:    Please follow up with your neurologist in next couple of weeks for recheck.   [facility] EMERGENCY DEPARTMENT [address] [id number] mc [place] [address] [phone]  If symptoms worsen   Pt seen in conjunction with Dr. Audree Camel,  MD  Ames Dura, DO [facility] Emergency Medicine Resident - PGY-2    Ames Dura, MD 04/27/14 0865  Audree Camel, MD 04/30/14 [id number]

## 2014-04-26 NOTE — ED Notes (Signed)
The pt has autiusm and seizures no seizures for 6 years until today  Grand mal..  No tongue damage no incontinence of bowel or bladder.  The pt is non-verbal/  He was out of keppra for 24 hours until the doctor called the med in.  No recent illness.  When he had the seizure he fell and is having some rt shoulder and arm pain  Both parents are with him

## 2014-04-26 NOTE — Discharge Instructions (Signed)

## 2014-04-26 NOTE — ED Notes (Signed)
Pt family states- they were out of his keppra for 24 hours, she (mom) got it filled this morning but was going to hold it until his bedtime dose. Then he had a seizure lasting about a minute and a half, patient was not too drowsy afterwards and has eaten and drunken since then. She gave him his morning dose after the seizure.

## 2014-04-27 ENCOUNTER — Telehealth: Payer: Self-pay | Admitting: Family

## 2014-04-27 NOTE — Telephone Encounter (Signed)
I reviewed your note and agree with your assessment and recommendations to mother. 

## 2014-04-27 NOTE — Telephone Encounter (Signed)
I received a fax from [facility], [facility] because she was having difficulty getting his Rx filled and because [name] had a seizure on 04/26/14 after being out of medication for a couple of days. Mom said that pharmacy told her that they did not have a refill when it was actually sent in by this office on 04/23/14.  She was told that the office had not been responsive to their request for refill. [name] ran out of medication while Mom was trying to work this out. We did not receive a phone call from Mom last week about this problem. The pharmacy sent in refill request on 04/22/14 after 5pm and the refill was approved on 04/23/14 at 9:12AM. Mom said that [name] had a seizure yesterday afternoon after being out of medication since 04/24/14 AM. Mom said that he was seen in the ER and given an extra dose of Levetiracetam, then restarted on same dose. She talked to different pharmacist after his ER visit and learned that the refill had been at pharmacy and ready for pick up since 04/23/14. I talked to Mom and told her that if she had problems like this again to call the office during business hours so that I can call pharmacy directly and work out refill issue. I also told her that nothing new was wrong with Darshawn to cause him to have a seizure, and explained about low medication level in his system. I told her that I would be happy to see him, but that he needed to get back on schedule with his medication and not miss more doses, not change to new medication. She said that was suggested to her by pharmacy and ER. I explained why he did not need new medication and again stressed importance of compliance. She agreed with these instructions and decided against an appointment for now, saying that [name] was doing well since the ER visit. TG

## 2014-07-26 ENCOUNTER — Other Ambulatory Visit: Payer: Self-pay | Admitting: Family

## 2014-08-24 ENCOUNTER — Encounter: Payer: Self-pay | Admitting: Family

## 2014-08-24 ENCOUNTER — Ambulatory Visit (INDEPENDENT_AMBULATORY_CARE_PROVIDER_SITE_OTHER): Payer: Medicare Other | Admitting: Family

## 2014-08-24 VITALS — BP 126/76 | HR 82 | Ht 66.5 in | Wt 170.0 lb

## 2014-08-24 DIAGNOSIS — E663 Overweight: Secondary | ICD-10-CM | POA: Diagnosis not present

## 2014-08-24 DIAGNOSIS — F840 Autistic disorder: Secondary | ICD-10-CM

## 2014-08-24 DIAGNOSIS — G4700 Insomnia, unspecified: Secondary | ICD-10-CM

## 2014-08-24 DIAGNOSIS — G40309 Generalized idiopathic epilepsy and epileptic syndromes, not intractable, without status epilepticus: Secondary | ICD-10-CM

## 2014-08-24 NOTE — Progress Notes (Signed)
Patient: Casey Bryant MRN: [id number] Sex: male DOB: 27-Jul-1990  Provider: Elveria RisingGOODPASTURE, Valeree Leidy, NP [facility]  Note type: Routine return visit  History of Present Illness: Referral Source: [facility]  History from: foster mother Chief Complaint: Seizures/Autism   Casey Bryant is a 24 y.o. young man with history of autism and seizure disorder. He was last seen Sep 03, 2013. Casey Bryant is taking and tolerating Lamotrogine, Depakote and Levetiracetam. His last seizure occurred in January 2016 in the setting of missed medication. Prior to that his last seizure occurred in November 2010.   When Casey Bryant was last seen, he had gained considerable weight. Today he weighs 170 lbs which is a 16 lb weight loss. Mom reports that he is a picky eater and has a variable appetite. Casey Bryant has intermittent problems with insomnia. There are no problems with behavior for the most part.   Casey Bryant's foster mother says that he has been otherwise healthy since last seen and that she has no new concerns about his health.   Review of Systems: Please see the HPI for neurologic and other pertinent review of systems. Otherwise, the following systems are noncontributory including constitutional, eyes, ears, nose and throat, cardiovascular, respiratory, gastrointestinal, genitourinary, musculoskeletal, skin, endocrine, hematologic/lymph, allergic/immunologic and psychiatric.   Past Medical History  Diagnosis Date  . Seizures    Hospitalizations: No., Head Injury: No., Nervous System Infections: No., Immunizations up to date: Yes.   Past Medical History Comments: He has a twin brother with autism and seizures.  Surgical History Past Surgical History  Procedure Laterality Date  . Circumcision  1990    Family History family history includes Cancer in his mother. Family History is otherwise negative for migraines, seizures, cognitive impairment, blindness,  deafness, birth defects, chromosomal disorder, autism.  Social History History   Social History  . Marital Status: Single    Spouse Name: N/A  . Number of Children: N/A  . Years of Education: N/A   Social History Main Topics  . Smoking status: Never Smoker   . Smokeless tobacco: Never Used  . Alcohol Use: No  . Drug Use: No  . Sexual Activity: No   Other Topics Concern  . None   Social History Narrative   Educational level: special education School Attending: [employer]  Living with:  [name] his AFL Provider   Hobbies/Interest: Enjoys music, watching TV and he likes to be alone. School comments:  None   Allergies No Known Allergies  Physical Exam BP 126/76 mmHg  Pulse 82  Ht 5' 6.5" (1.689 m)  Wt 170 lb (77.111 kg)  BMI 27.03 kg/m2   General: well developed, well nourished male, seated, in no evident distress. He had intermittent flapping motions of his hands. Head: head normocephalic and atraumatic.  Ears, Nose and Throat: oropharynx benign Neck: supple with no carotid or supraclavicular bruits. Respiratory: lungs clear to auscultation Cardiovascular: regular rate and rhythm, no murmurs Musculoskeletal: no obvious deformities or scoliosis Skin: no rashes Trunk: normal alignment and mobility, no deformity  Neurologic Exam  Mental Status: Awake and fully alert. He has no language. He responded appropriately to some basic commands and tolerated invasion into his space fairly well. He smiled responsively at times. Cranial Nerves: Fundoscopic exam reveals red reflex. Unable to fully visual fundus due to lack of patient's cooperation. Pupils equal, briskly reactive to light. Extraocular movements appear full. Tracks objects in his visual field. Turns to localize sounds.  Neck flexion and extension normal. Motor: Unable to fully assess but appears to have normal bulk, tone and strength. Sensory: Withdrawal x 4. Coordination: Unable to fully assess but  transferred a toy from hand to hand and demonstrated fairly good fine motor movements with his hands.  Gait and Station: Arises from chair without difficulty. Stance is wide based. He walked easily but was hesitant to walk without coaching from another person.  Reflexes: Unable to fully assess but DTR's appear to be absent bilaterally.  Impression 1. Generalized convulsive epilepsy 2. Autism 3. Insomnia 4. Overweight   Recommendations for plan of care [facility]. Casey Bryant has neither had nor required imaging or lab studies since the last visit. He is a 24 year old young man with history of autism and seizure disorder. He is taking and tolerating Lamotrigine, Levetiracetam and Depakote, and has been seizure free since January 2016, when he had a seizure in the setting of missed medication. He has lost some weight since last year and I talked with his foster mother about that. He is closer now to a normal BMI, and I encouraged her to continue to monitor his intake. He will otherwise continue his medications without change and will return for follow up in one year or sooner if needed.   The medication list was reviewed and reconciled.  No changes were made in the prescribed medications today.  A complete medication list was provided to the his foster mother.  Total time spent with the patient was 20 minutes, of which 50% or more was spent in counseling and coordination of care.

## 2014-08-26 NOTE — Patient Instructions (Signed)
Continue Casey Bryant's medications without change for now. Let me know if he has any seizures.   Continue to monitor his weight. He has lost some weight and now has a BMI of 27.03, which is closer to a normal BMI of 18.5 to 24.9.  I have listed his last 3 weights recorded for your review.   Wt Readings from Last 3 Encounters:  08/24/14 170 lb (77.111 kg)  04/26/14 178 lb (80.74 kg)  09/03/13 186 lb 3.2 oz (84.46 kg)   Please plan to return for follow up in 1 year or sooner if needed.

## 2014-09-14 ENCOUNTER — Other Ambulatory Visit: Payer: Self-pay | Admitting: Family

## 2014-10-13 ENCOUNTER — Other Ambulatory Visit: Payer: Self-pay | Admitting: Family

## 2014-12-22 DIAGNOSIS — Z0000 Encounter for general adult medical examination without abnormal findings: Secondary | ICD-10-CM | POA: Diagnosis not present

## 2014-12-22 DIAGNOSIS — Z23 Encounter for immunization: Secondary | ICD-10-CM | POA: Diagnosis not present

## 2014-12-22 DIAGNOSIS — Z79899 Other long term (current) drug therapy: Secondary | ICD-10-CM | POA: Diagnosis not present

## 2015-03-16 ENCOUNTER — Other Ambulatory Visit: Payer: Self-pay | Admitting: Family

## 2015-04-10 ENCOUNTER — Other Ambulatory Visit: Payer: Self-pay | Admitting: Family

## 2015-04-28 ENCOUNTER — Other Ambulatory Visit: Payer: Self-pay | Admitting: Family

## 2015-05-31 DIAGNOSIS — R58 Hemorrhage, not elsewhere classified: Secondary | ICD-10-CM | POA: Diagnosis not present

## 2015-08-23 DIAGNOSIS — J0100 Acute maxillary sinusitis, unspecified: Secondary | ICD-10-CM | POA: Diagnosis not present

## 2015-08-24 ENCOUNTER — Ambulatory Visit (INDEPENDENT_AMBULATORY_CARE_PROVIDER_SITE_OTHER): Payer: Medicare Other | Admitting: Family

## 2015-08-24 ENCOUNTER — Encounter: Payer: Self-pay | Admitting: Family

## 2015-08-24 VITALS — BP 130/80 | HR 84 | Ht 66.0 in | Wt 167.6 lb

## 2015-08-24 DIAGNOSIS — F840 Autistic disorder: Secondary | ICD-10-CM

## 2015-08-24 DIAGNOSIS — G40309 Generalized idiopathic epilepsy and epileptic syndromes, not intractable, without status epilepticus: Secondary | ICD-10-CM | POA: Diagnosis not present

## 2015-08-24 DIAGNOSIS — E663 Overweight: Secondary | ICD-10-CM

## 2015-08-24 NOTE — Progress Notes (Signed)
Patient: Casey Bryant MRN: [id number] Sex: male DOB: [birth year]  Provider: Elveria Rising, NP Location of Care: [facility]  Note type: Routine return visit  History of Present Illness: Referral Source: [facility] History from: referring office, [facility] chart and foster mother Chief Complaint: Epilepsy, Autism  Casey Bryant is a 25 y.o. young man with history of autism and seizure disorder. He was last seen Aug 24, 2014. Casey Bryant is taking and tolerating Lamotrogine, Depakote and Levetiracetam. His last seizure occurred in January 2016 in the setting of missed medication. Prior to that his last seizure occurred in November 2010.   Casey Bryant has had difficulty in the past with weight gain. Mom reports that he is a picky eater and has a variable appetite. His weight is more appropriate today with a BMI of 27.  Casey Bryant has intermittent problems with insomnia. There are no problems with behavior for the most part.   Casey Bryant has nasal congestion from seasonal allergies today. He is unaware of the nasal discharge and resists slightly when his foster mother cleans his nose.   Casey Bryant's foster mother is concerned because he tends to hold his ear and lean his head to that side. She says that he does it when walking, despite being prompted to stand straight. He has not exhibited ataxia or experienced falls.   Casey Bryant's foster mother says that he has been otherwise healthy since last seen and that she has no new concerns about his health other than previously mentioned.   Review of Systems: Please see the HPI for neurologic and other pertinent review of systems. Otherwise, the following systems are noncontributory including constitutional, eyes, ears, nose and throat, cardiovascular, respiratory, gastrointestinal, genitourinary, musculoskeletal, skin, endocrine, hematologic/lymph, allergic/immunologic and psychiatric.   Past Medical History  Diagnosis Date  . Seizures  (HCC)    Hospitalizations: No., Head Injury: No., Nervous System Infections: No., Immunizations up to date: Yes.   Past Medical History Comments: He has a twin brother with autism and seizures  Surgical History Past Surgical History  Procedure Laterality Date  . Circumcision  1990    Family History family history includes Cancer in his mother. Family History is otherwise negative for migraines, seizures, cognitive impairment, blindness, deafness, birth defects, chromosomal disorder, autism.  Social History Social History   Social History  . Marital Status: Single    Spouse Name: N/A  . Number of Children: N/A  . Years of Education: N/A   Social History Main Topics  . Smoking status: Never Smoker   . Smokeless tobacco: Never Used  . Alcohol Use: No  . Drug Use: No  . Sexual Activity: No   Other Topics Concern  . None   Social History Narrative   His biologic mother is deceased. He lives with foster mother and twin brother, who also has autism and seizures.       Allergies No Known Allergies  Physical Exam BP 130/80 mmHg  Pulse 84  Ht 5\' 6"  (1.676 m)  Wt 167 lb 9.6 oz (76.023 kg)  BMI 27.06 kg/m2 General: well developed, well nourished male, seated, in no evident distress. He had intermittent flapping motions of his hands. Head: head normocephalic and atraumatic.  Ears, Nose and Throat: oropharynx benign Neck: supple with no carotid or supraclavicular bruits. Respiratory: lungs clear to auscultation Cardiovascular: regular rate and rhythm, no murmurs Musculoskeletal: no obvious deformities or scoliosis Skin: no rashes Trunk: normal alignment and mobility, no deformity  Neurologic Exam  Mental Status: Awake and fully alert. He has no language. He responded appropriately to some basic commands and tolerated invasion into his space fairly well. He smiled responsively at times. He is unaware of his significant nasal discharge.  Cranial Nerves: Fundoscopic exam  reveals red reflex. Unable to fully visual fundus due to lack of patient's cooperation. Pupils equal, briskly reactive to light. Extraocular movements appear full. Tracks objects in his visual field. Turns to localize sounds. Neck flexion and extension normal. Motor: Unable to fully assess but appears to have normal bulk, tone and strength. Sensory: Withdrawal x 4. Coordination: Unable to fully assess but transferred a toy from hand to hand and demonstrated fairly good fine motor movements with his hands.  Gait and Station: Arises from chair without difficulty. Stance is wide based. He walked easily but was hesitant to walk without coaching from another person.  Reflexes: Unable to fully assess but DTR's appear to be absent bilaterally.  Impression 1.  Generalized convulsive epilepsy 2. Autism 3. Insomnia 4. History of weight gain   Recommendations for plan of care The patient's previous [facility] records were reviewed. Casey Bryant has neither had nor required imaging or lab studies since the last visit. He is a 25 year old young man with history of autism and seizure disorder. He is taking and tolerating Lamotrigine, Levetiracetam and Depakote, and has been seizure free since January 2016, when he had a seizure in the setting of missed medication. Casey Bryant has had problems with weight gain in the past but his weight is improved with a BMI of 27 today. I encouraged his foster mother to continue to monitor his intake. I talked with his foster mother about her concerns about him holding his ear and leaning his head to one side. His ear examination was normal bilaterally. When walking, he was able to walk with his hands down and his head in midline position, but would revert to holding his ear and leaning his head. This appears to be a behavior that he has simply adopted, rather than an indicator of something wrong, as his foster mother feared. I reassured his foster mother about the behavior and asked her to  continue to prompt him to hold his head in midline and to let me know if he develops any other concerning behaviors. He will otherwise continue his medications without change and will return for follow up in one year or sooner if needed. Mom agreed with the plans made today.  Wt Readings from Last 3 Encounters:  08/24/15 167 lb 9.6 oz (76.023 kg)  08/24/14 170 lb (77.111 kg)  04/26/14 178 lb (80.74 kg)   The medication list was reviewed and reconciled.  No changes were made in the prescribed medications today.  A complete medication list was provided to the patient's foster mother.    Medication List       This list is accurate as of: 08/24/15  9:30 AM.  Always use your most recent med list.               Casey Bryant 875-125 MG tablet  Generic drug:  amoxicillin-clavulanate  Take by mouth.     DEPAKOTE SPRINKLES 125 MG capsule  Generic drug:  divalproex  TAKE 5 CAPSULES BY MOUTH EVERY MORNING AND TAKE 6 CAPSULES EVERY EVENING     lamoTRIgine 100 MG tablet  Commonly known as:  LAMICTAL  TAKE 1 TABLET BY MOUTH IN THE MORNING AND 1 TABLET AT NIGHT     lamoTRIgine 100 MG  tablet  Commonly known as:  LAMICTAL  TAKE 1 TABLET BY MOUTH IN THE MORNING AND 1 TABLET AT NIGHT     levETIRAcetam 100 MG/ML solution  Commonly known as:  KEPPRA  TAKE 1 & 1/2 TEASPOONFULS (7.5 ML ) BY MOUTH 2 TIMES A DAY     multivitamin with minerals tablet  Take 1 tablet by mouth daily.       Total time spent with the patient was 20 minutes, of which 50% or more was spent in counseling and coordination of care.   Elveria Risingina Taneesha Edgin

## 2015-08-25 NOTE — Patient Instructions (Signed)
Continue giving Casey Bryant's medications as you have been giving them.   Let me know if he has any seizures or any behavior suspicious for seizures.   Please plan to return for follow up in 1 year or sooner if needed.

## 2015-09-06 IMAGING — DX DG SHOULDER 2+V*R*
3 series · 3 of 3 positions shown · non-contrast
Comparison: None.

CLINICAL DATA: Seizure.  Fall.  Right shoulder pain.

EXAM:
RIGHT SHOULDER - 2+ VIEW

[shoulder grashey]
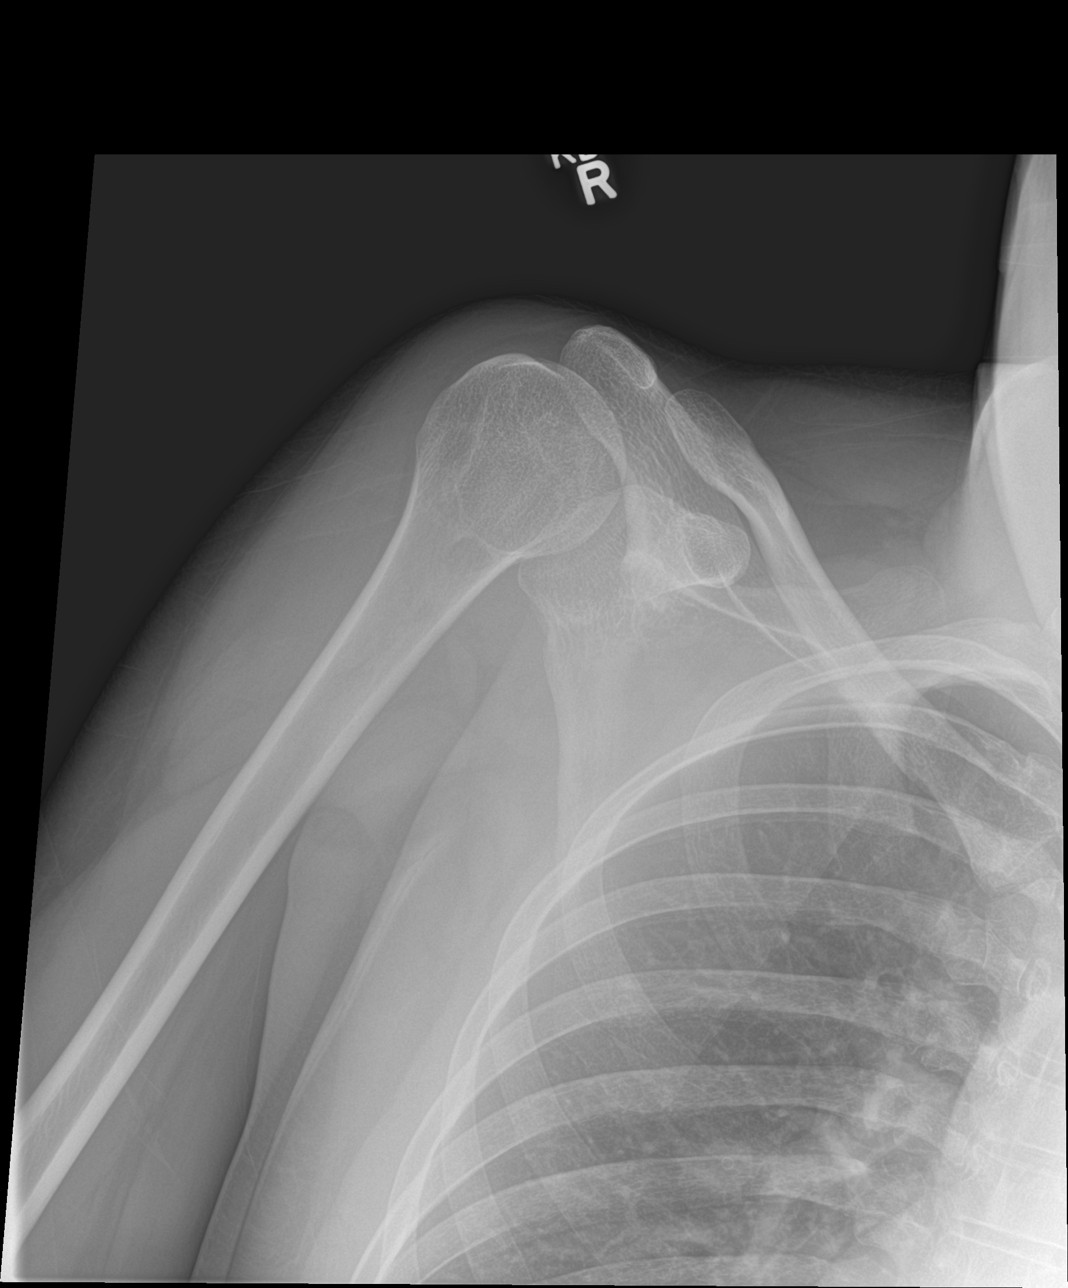

[shoulder y view]
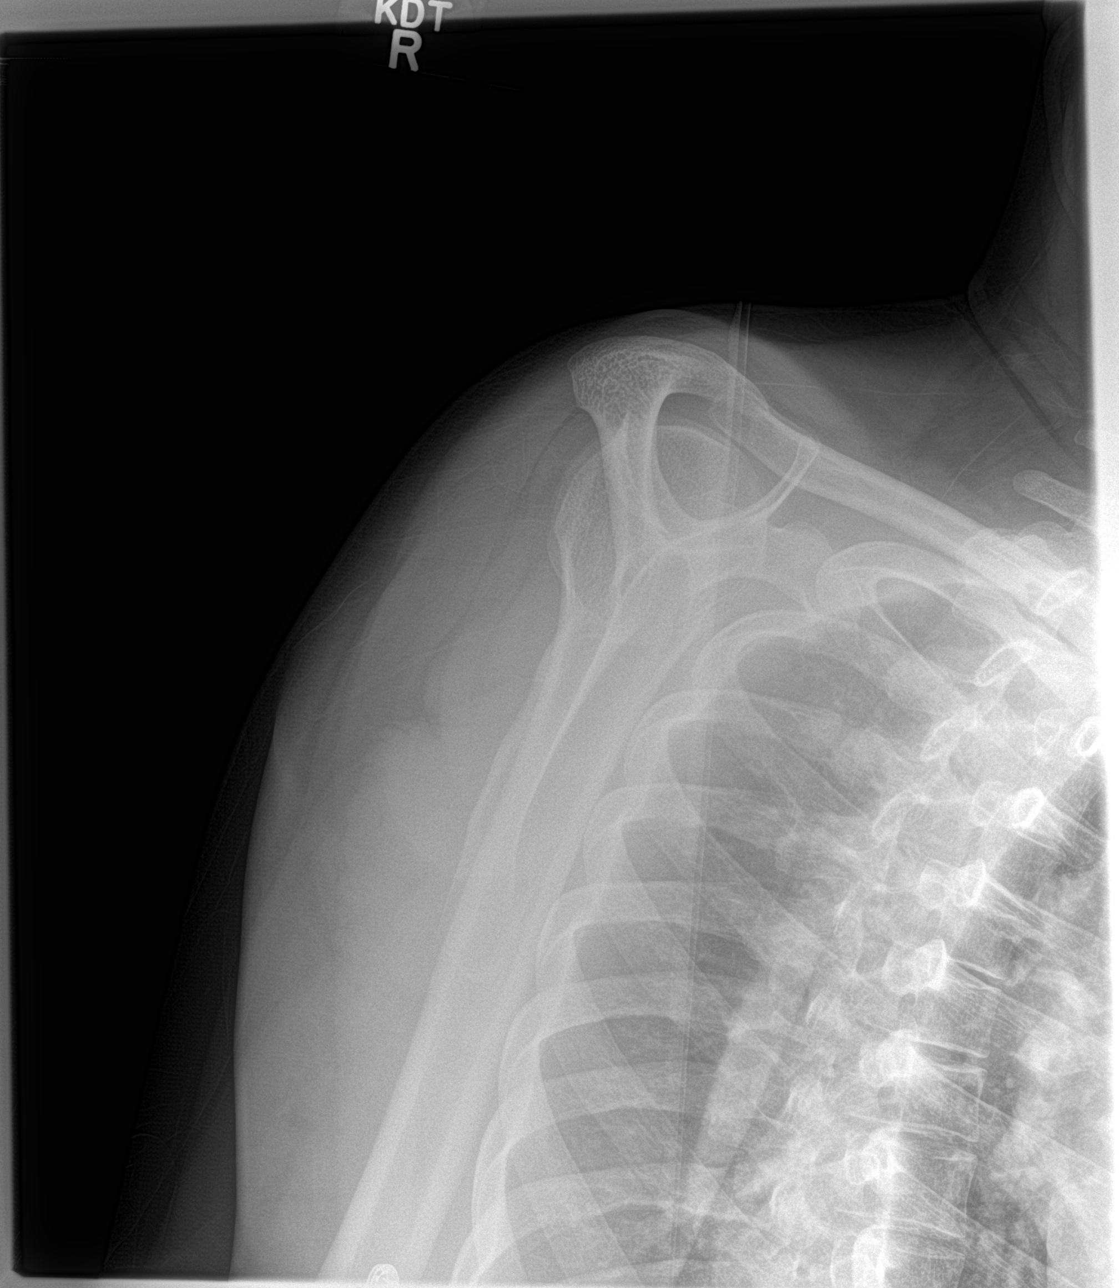

[shoulder axillary]
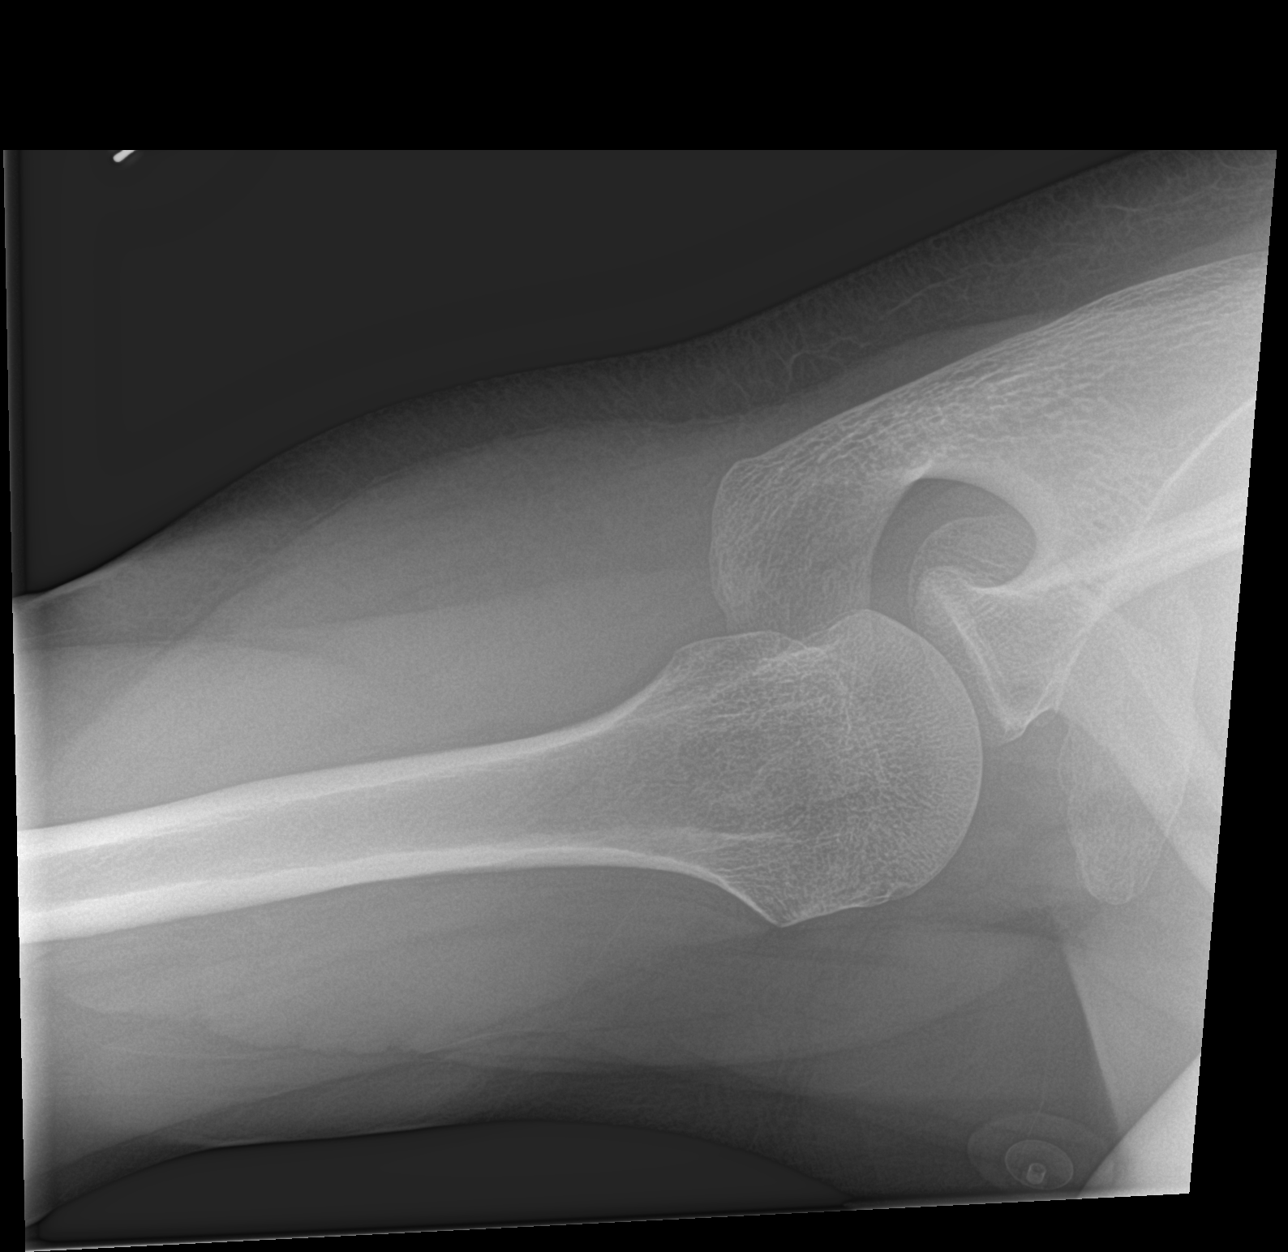

[3 of 3 positions shown; findings below may reference images not displayed]

FINDINGS: There is no evidence of fracture or dislocation. There is no
evidence of arthropathy or other focal bone abnormality. Soft
tissues are unremarkable.
IMPRESSION: Negative.

## 2015-09-13 ENCOUNTER — Other Ambulatory Visit: Payer: Self-pay | Admitting: Family

## 2015-09-23 ENCOUNTER — Other Ambulatory Visit: Payer: Self-pay | Admitting: Family

## 2015-11-01 ENCOUNTER — Other Ambulatory Visit: Payer: Self-pay | Admitting: Family

## 2015-11-01 DIAGNOSIS — G40309 Generalized idiopathic epilepsy and epileptic syndromes, not intractable, without status epilepticus: Secondary | ICD-10-CM

## 2015-11-01 MED ORDER — LEVETIRACETAM 100 MG/ML PO SOLN: ORAL | 5 refills | Status: DC

## 2015-11-01 NOTE — Telephone Encounter (Signed)
Mom left a message requesting Levetiracetam refill. Refill sent electronically as requested. TG

## 2015-12-16 DIAGNOSIS — K625 Hemorrhage of anus and rectum: Secondary | ICD-10-CM | POA: Diagnosis not present

## 2015-12-16 DIAGNOSIS — F8189 Other developmental disorders of scholastic skills: Secondary | ICD-10-CM | POA: Diagnosis not present

## 2015-12-28 DIAGNOSIS — Z23 Encounter for immunization: Secondary | ICD-10-CM | POA: Diagnosis not present

## 2015-12-28 DIAGNOSIS — Z136 Encounter for screening for cardiovascular disorders: Secondary | ICD-10-CM | POA: Diagnosis not present

## 2015-12-28 DIAGNOSIS — F8189 Other developmental disorders of scholastic skills: Secondary | ICD-10-CM | POA: Diagnosis not present

## 2015-12-28 DIAGNOSIS — Z13220 Encounter for screening for lipoid disorders: Secondary | ICD-10-CM | POA: Diagnosis not present

## 2015-12-28 DIAGNOSIS — H9201 Otalgia, right ear: Secondary | ICD-10-CM | POA: Diagnosis not present

## 2015-12-28 DIAGNOSIS — Z0001 Encounter for general adult medical examination with abnormal findings: Secondary | ICD-10-CM | POA: Diagnosis not present

## 2015-12-28 DIAGNOSIS — K5900 Constipation, unspecified: Secondary | ICD-10-CM | POA: Diagnosis not present

## 2015-12-28 DIAGNOSIS — E663 Overweight: Secondary | ICD-10-CM | POA: Diagnosis not present

## 2016-01-27 DIAGNOSIS — K5909 Other constipation: Secondary | ICD-10-CM | POA: Diagnosis not present

## 2016-01-27 DIAGNOSIS — K625 Hemorrhage of anus and rectum: Secondary | ICD-10-CM | POA: Diagnosis not present

## 2016-01-27 DIAGNOSIS — F8189 Other developmental disorders of scholastic skills: Secondary | ICD-10-CM | POA: Diagnosis not present

## 2016-03-11 ENCOUNTER — Other Ambulatory Visit: Payer: Self-pay | Admitting: Family

## 2016-03-12 ENCOUNTER — Other Ambulatory Visit: Payer: Self-pay | Admitting: Family

## 2016-04-29 ENCOUNTER — Other Ambulatory Visit: Payer: Self-pay | Admitting: Family

## 2016-04-29 DIAGNOSIS — G40309 Generalized idiopathic epilepsy and epileptic syndromes, not intractable, without status epilepticus: Secondary | ICD-10-CM

## 2016-08-23 ENCOUNTER — Ambulatory Visit (INDEPENDENT_AMBULATORY_CARE_PROVIDER_SITE_OTHER): Payer: Medicare Other | Admitting: Family

## 2016-08-23 ENCOUNTER — Encounter (INDEPENDENT_AMBULATORY_CARE_PROVIDER_SITE_OTHER): Payer: Self-pay | Admitting: Family

## 2016-08-23 VITALS — BP 120/76 | HR 80 | Ht 66.14 in | Wt 172.0 lb

## 2016-08-23 DIAGNOSIS — F5101 Primary insomnia: Secondary | ICD-10-CM | POA: Diagnosis not present

## 2016-08-23 DIAGNOSIS — F840 Autistic disorder: Secondary | ICD-10-CM

## 2016-08-23 DIAGNOSIS — G40309 Generalized idiopathic epilepsy and epileptic syndromes, not intractable, without status epilepticus: Secondary | ICD-10-CM | POA: Diagnosis not present

## 2016-08-23 MED ORDER — LAMOTRIGINE 100 MG PO TABS: ORAL_TABLET | ORAL | 5 refills | Status: DC

## 2016-08-23 MED ORDER — DEPAKOTE SPRINKLES 125 MG PO CSDR: DELAYED_RELEASE_CAPSULE | ORAL | 5 refills | Status: DC

## 2016-08-23 MED ORDER — LEVETIRACETAM 100 MG/ML PO SOLN: ORAL | 5 refills | Status: DC

## 2016-08-23 NOTE — Progress Notes (Signed)
Patient: Casey Bryant MRN: [id number] Sex: male DOB: October 16, 1990  Provider: Elveria Risingina Raniyah Curenton, NP [facility] Care[facility]  Note type: Routine return visit  History of Present Illness: Referral Source: Harrietta GuardianSarah Bailey FNP History from: mother Chief Complaint: follow up on Seizures  Casey Bryant is a 26 y.o. man with history of autism and seizure disorder. He was last seen Aug 24, 2015. Casey Bryant is taking and tolerating Lamotrogine, Depakote and Levetiracetam. His last seizure occurred in January 2016 in the setting of missed medication. Prior to that his last seizure occurred in November 2010.   Casey Bryant has had difficulty in the past with weight gain. Mom reports that he is a picky eater and has a variable appetite. Casey Bryant has intermittent problems with insomnia. There have been no problems with behavior.   Casey Bryant has nasal congestion from seasonal allergies today. He is unaware of the nasal discharge and resists slightly when his foster mother cleans his nose. He tends to hold his right ear all the time, but examination of the ear is normal.   Casey Bryant's foster mother says that he has been otherwise healthy since last seen and that she has no new concerns about his health other than previously mentioned.   Review of Systems: Please see the HPI for neurologic and other pertinent review of systems. Otherwise, the following systems are noncontributory including constitutional, eyes, ears, nose and throat, cardiovascular, respiratory, gastrointestinal, genitourinary, musculoskeletal, skin, endocrine, hematologic/lymph, allergic/immunologic and psychiatric.   Past Medical History:  Diagnosis Date  . Seizures (HCC)    Hospitalizations: No., Head Injury: No., Nervous System Infections: No., Immunizations up to date: Yes.   Past Medical History Comments: He has a twin brother with autism and seizures  Surgical History Past Surgical History:  Procedure Laterality Date  .  CIRCUMCISION  1990    Family History family history includes Cancer in his mother. Family History is otherwise negative for migraines, seizures, cognitive impairment, blindness, deafness, birth defects, chromosomal disorder, autism.  Social History Social History   Social History  . Marital status: Single    Spouse name: N/A  . Number of children: N/A  . Years of education: N/A   Social History Main Topics  . Smoking status: Never Smoker  . Smokeless tobacco: Never Used  . Alcohol use No  . Drug use: No  . Sexual activity: No   Other Topics Concern  . None   Social History Narrative   His biologic mother is deceased. He lives with foster mother and twin brother, who also has autism and seizures.       Allergies No Known Allergies  Physical Exam BP 120/76   Pulse 80   Ht 5' 6.14" (1.68 m)   Wt 171 lb 15.3 oz (78 kg)   BMI 27.64 kg/m  General: well developed, well nourished male, seated, in no evident distress. He had intermittent flapping motions of his hands. Head: head normocephalic and atraumatic.  Ears, Nose and Throat: oropharynx benign Neck: supple with no carotid or supraclavicular bruits. Respiratory: lungs clear to auscultation Cardiovascular: regular rate and rhythm, no murmurs Musculoskeletal: no obvious deformities or scoliosis Skin: no rashes Trunk: normal alignment and mobility, no deformity  Neurologic Exam  Mental Status: Awake and fully alert. He has no language. He responded appropriately to some basic commands and tolerated invasion into his space fairly well. He smiled responsively at times. He is unaware of his significant nasal discharge.  Cranial Nerves: Fundoscopic exam reveals red  reflex. Unable to fully visual fundus due to lack of patient's cooperation. Pupils equal, briskly reactive to light. Extraocular movements appear full. Tracks objects in his visual field. Turns to localize sounds. Neck flexion and extension normal. Motor:  Unable to fully assess but appears to have normal bulk, tone and strength. Sensory: Withdrawal x 4. Coordination: Unable to fully assess but transferred a toy from hand to hand and demonstrated fairly good fine motor movements with his hands.  Gait and Station: Arises from chair without difficulty. Stance is wide based. He walked easily but was hesitant to walk without coaching from another person.  Reflexes: Unable to fully assess but DTR's appear to be absent bilaterally.  Impression 1. Generalized convulsive epilepsy 2. Autism 3. Insomnia 4. History of weight gain  Recommendations for plan of care The patient's previous [facility] records were reviewed. Estevon has neither had nor required imaging or lab studies since the last visit. He is a 26 year old young man with history of autism and seizure disorder. He is taking and tolerating Lamotrigine, Levetiracetam and Depakote, and has been seizure free since January 2016, when he had a seizure in the setting of missed medication. Haydan has had problems with weight gain in the past but his weight has been stable since he was last seen. I encouraged his foster mother to continue to monitor his intake. He will otherwise continue his medications without change and will return for follow up in one year or sooner if needed. Mom agreed with the plans made today.  The medication list was reviewed and reconciled.  No changes were made in the prescribed medications today.  A complete medication list was provided to the patient/caregiver.  Allergies as of 08/23/2016   No Known Allergies     Medication List       Accurate as of 08/23/16 11:59 PM. Always use your most recent med list.          DEPAKOTE SPRINKLES 125 MG capsule Generic drug:  divalproex TAKE 5 CAPSULES BY MOUTH EVERY MORNING AND 6 CAPSULES BY MOUTH EVERY EVENING   lamoTRIgine 100 MG tablet Commonly known as:  LAMICTAL TAKE 1 TABLET BY MOUTH IN THE MORNING AND 1 TABLET AT NIGHT     levETIRAcetam 100 MG/ML solution Commonly known as:  KEPPRA TAKE 1 & 1/2 TEASPOONFULS (7.5 ML ) BY MOUTH 2 TIMES A DAY   multivitamin with minerals tablet Take 1 tablet by mouth daily.       Total time spent with the patient was 20 minutes, of which 50% or more was spent in counseling and coordination of care.   Elveria Rising NP-C

## 2016-08-23 NOTE — Patient Instructions (Addendum)
Continue Casey Bryant's seizure medications as you have been giving them. I have sent refills to the pharmacy for you. Let me know if he has any seizures.   Please return for follow up in 1 year or sooner if needed.

## 2016-08-23 NOTE — Progress Notes (Deleted)
   Patient: Casey Bryant Rocque MRN: [id number] Sex: male DOB: January 22, 1991  Provider: Elveria Risingina Aneliz Carbary, NP [facility]  Note type: {CN NOTE TYPES:210120001}  History of Present Illness: Referral Source: *** History from: {CN REFERRED JY:[id number]}BY:210120002} Chief Complaint: ***  Casey Bryant Spinello is a 26 y.o. with history of   Neither nor  mother have other health concerns for   today other than previously mentioned.  Review of Systems: Please see the HPI for neurologic and other pertinent review of systems. Otherwise, the following systems are noncontributory including constitutional, eyes, ears, nose and throat, cardiovascular, respiratory, gastrointestinal, genitourinary, musculoskeletal, skin, endocrine, hematologic/lymph, allergic/immunologic and psychiatric.   Past Medical History:  Diagnosis Date  . Seizures (HCC)    Hospitalizations: {yes no:314532}, Head Injury: {yes no:314532}, Nervous System Infections: {yes no:314532}, Immunizations up to date: {yes no:314532} Past Medical History Comments: ***.  Surgical History Past Surgical History:  Procedure Laterality Date  . CIRCUMCISION  1990    Family History family history includes Cancer in his mother. Family History is otherwise negative for migraines, seizures, cognitive impairment, blindness, deafness, birth defects, chromosomal disorder, autism.  Social History Social History   Social History  . Marital status: Single    Spouse name: N/A  . Number of children: N/A  . Years of education: N/A   Social History Main Topics  . Smoking status: Never Smoker  . Smokeless tobacco: Never Used  . Alcohol use No  . Drug use: No  . Sexual activity: No   Other Topics Concern  . None   Social History Narrative   His biologic mother is deceased. He lives with foster mother and twin brother, who also has autism and seizures.       Allergies No Known Allergies  Physical Exam BP 120/76   Pulse  80   Ht 5' 6.14" (1.68 m)   Wt 171 lb 15.3 oz (78 kg)   BMI 27.64 kg/m  ***  Impression 1. 2. 3.   Recommendations for plan of care [facility]. [name] has neither had nor required imaging or lab studies since the last visit.   The medication list was reviewed and reconciled.  No changes were made in the prescribed medications today.  A complete medication list was provided to the patient/caregiver.  Allergies as of 08/23/2016   No Known Allergies     Medication List       Accurate as of 08/23/16  9:48 AM. Always use your most recent med list.          DEPAKOTE SPRINKLES 125 MG capsule Generic drug:  divalproex TAKE 5 CAPSULES BY MOUTH EVERY MORNING AND 6 CAPSULES BY MOUTH EVERY EVENING   lamoTRIgine 100 MG tablet Commonly known as:  LAMICTAL TAKE 1 TABLET BY MOUTH IN THE MORNING AND 1 TABLET AT NIGHT   levETIRAcetam 100 MG/ML solution Commonly known as:  KEPPRA TAKE 1 & 1/2 TEASPOONFULS (7.5 ML ) BY MOUTH 2 TIMES A DAY   multivitamin with minerals tablet Take 1 tablet by mouth daily.       Dr. Sharene SkeansHickling was consulted regarding the patient.   Total time spent with the patient was ***  minutes, of which 50% or more was spent in counseling and coordination of care.   Elveria Risingina Joson Sapp

## 2016-09-13 ENCOUNTER — Other Ambulatory Visit (INDEPENDENT_AMBULATORY_CARE_PROVIDER_SITE_OTHER): Payer: Self-pay | Admitting: Family

## 2016-09-13 DIAGNOSIS — G40309 Generalized idiopathic epilepsy and epileptic syndromes, not intractable, without status epilepticus: Secondary | ICD-10-CM

## 2016-09-19 ENCOUNTER — Telehealth (INDEPENDENT_AMBULATORY_CARE_PROVIDER_SITE_OTHER): Payer: Self-pay | Admitting: Pediatrics

## 2016-09-19 NOTE — Telephone Encounter (Signed)
Thank you for letting Mom know. TG

## 2016-09-19 NOTE — Telephone Encounter (Signed)
Thank you for calling the pharmacy. Does Mom know? Casey Bryant

## 2016-09-19 NOTE — Telephone Encounter (Signed)
  Who's calling (name and relationship to patient) : [name], mother  Best contact number: [phone]  Provider they see: Sharene SkeansHickling / Elveria Risingina Goodpasture  Reason for call: Mother called in for refill on Depakote.  She stated prior authorization is needed for refill.  She can be reached at [phone].     PRESCRIPTION REFILL ONLY  Name of prescription: Depakote Sprinkles  [facility] [place](confirmed with mother)

## 2016-09-19 NOTE — Telephone Encounter (Signed)
I left a voicemail on her phone

## 2016-09-19 NOTE — Telephone Encounter (Signed)
[facility] [place] and Leray's rx is ready for pick up. His brother Elvina MattesKeon's is not up for a refill until the 18th. This does not require a prior authorization

## 2016-09-19 NOTE — Telephone Encounter (Signed)
[facility] with [facility]ills. If prior authorization is needed, please ask [facility] to send information from the insurance as to what is required. Thanks, Inetta Fermoina

## 2017-01-04 DIAGNOSIS — Z79899 Other long term (current) drug therapy: Secondary | ICD-10-CM | POA: Diagnosis not present

## 2017-01-04 DIAGNOSIS — G40909 Epilepsy, unspecified, not intractable, without status epilepticus: Secondary | ICD-10-CM | POA: Diagnosis not present

## 2017-01-04 DIAGNOSIS — F840 Autistic disorder: Secondary | ICD-10-CM | POA: Diagnosis not present

## 2017-01-04 DIAGNOSIS — R635 Abnormal weight gain: Secondary | ICD-10-CM | POA: Diagnosis not present

## 2017-01-04 DIAGNOSIS — Z23 Encounter for immunization: Secondary | ICD-10-CM | POA: Diagnosis not present

## 2017-01-04 DIAGNOSIS — Z0000 Encounter for general adult medical examination without abnormal findings: Secondary | ICD-10-CM | POA: Diagnosis not present

## 2017-03-12 ENCOUNTER — Telehealth (INDEPENDENT_AMBULATORY_CARE_PROVIDER_SITE_OTHER): Payer: Self-pay

## 2017-03-12 DIAGNOSIS — G40309 Generalized idiopathic epilepsy and epileptic syndromes, not intractable, without status epilepticus: Secondary | ICD-10-CM

## 2017-03-12 MED ORDER — DEPAKOTE SPRINKLES 125 MG PO CSDR: DELAYED_RELEASE_CAPSULE | ORAL | 5 refills | Status: DC

## 2017-03-12 NOTE — Telephone Encounter (Signed)
Rx has been printed and placed on Tina's desk 

## 2017-03-12 NOTE — Telephone Encounter (Signed)
Rx has been faxed to the pharmacy 

## 2017-04-01 ENCOUNTER — Other Ambulatory Visit (INDEPENDENT_AMBULATORY_CARE_PROVIDER_SITE_OTHER): Payer: Self-pay | Admitting: Family

## 2017-04-01 DIAGNOSIS — G40309 Generalized idiopathic epilepsy and epileptic syndromes, not intractable, without status epilepticus: Secondary | ICD-10-CM

## 2017-05-04 ENCOUNTER — Other Ambulatory Visit (INDEPENDENT_AMBULATORY_CARE_PROVIDER_SITE_OTHER): Payer: Self-pay | Admitting: Family

## 2017-05-04 DIAGNOSIS — G40309 Generalized idiopathic epilepsy and epileptic syndromes, not intractable, without status epilepticus: Secondary | ICD-10-CM

## 2017-07-12 ENCOUNTER — Telehealth (INDEPENDENT_AMBULATORY_CARE_PROVIDER_SITE_OTHER): Payer: Self-pay | Admitting: Family

## 2017-07-12 NOTE — Telephone Encounter (Signed)
Noted.  Let me know if you need any help from me Inetta Fermoina.   Lorenz CoasterStephanie Neetu Carrozza MD MPH

## 2017-07-12 NOTE — Telephone Encounter (Signed)
°  Who's calling (name and relationship to patient) : °Carol (EC) °Best contact number: °[phone] °Provider they see: °Tina °Reason for call: °Carol requesting PA initiation for Depakote.  °

## 2017-07-12 NOTE — Telephone Encounter (Signed)
Mom called me back. She does not have the [facility] ID with her. She will get that number and call me back. TG

## 2017-07-12 NOTE — Telephone Encounter (Signed)
I left a message for Mom asking her to call me back. I need the [facility] Medicare Pharmacy Member ID# to be able to do the PA. TG

## 2017-07-13 NOTE — Telephone Encounter (Signed)
Thank you for doing that. TG 

## 2017-07-13 NOTE — Telephone Encounter (Signed)
[facility] to get [facility]'s [facility] information. The PA has been done and we are now waiting on approval

## 2017-07-13 NOTE — Telephone Encounter (Signed)
Received "Alternative Request" from [facility] for [facility] (Depakote)  *Document placed in provider basket up front*

## 2017-07-17 NOTE — Telephone Encounter (Signed)
The PA for Depakote was approved through 04/09/18. TG

## 2017-09-11 ENCOUNTER — Other Ambulatory Visit (INDEPENDENT_AMBULATORY_CARE_PROVIDER_SITE_OTHER): Payer: Self-pay | Admitting: Family

## 2017-09-11 DIAGNOSIS — G40309 Generalized idiopathic epilepsy and epileptic syndromes, not intractable, without status epilepticus: Secondary | ICD-10-CM

## 2017-09-12 ENCOUNTER — Telehealth: Payer: Self-pay | Admitting: Family

## 2017-09-12 NOTE — Telephone Encounter (Signed)
Returned phone call to schedule an appointment w/ Inetta Fermoina

## 2017-10-04 ENCOUNTER — Encounter (INDEPENDENT_AMBULATORY_CARE_PROVIDER_SITE_OTHER): Payer: Self-pay | Admitting: Family

## 2017-10-04 ENCOUNTER — Ambulatory Visit (INDEPENDENT_AMBULATORY_CARE_PROVIDER_SITE_OTHER): Payer: Medicare Other | Admitting: Family

## 2017-10-04 VITALS — BP 122/80 | HR 80 | Ht 66.0 in | Wt 177.2 lb

## 2017-10-04 DIAGNOSIS — E663 Overweight: Secondary | ICD-10-CM | POA: Diagnosis not present

## 2017-10-04 DIAGNOSIS — F5101 Primary insomnia: Secondary | ICD-10-CM

## 2017-10-04 DIAGNOSIS — Z79899 Other long term (current) drug therapy: Secondary | ICD-10-CM

## 2017-10-04 DIAGNOSIS — F840 Autistic disorder: Secondary | ICD-10-CM

## 2017-10-04 DIAGNOSIS — G40309 Generalized idiopathic epilepsy and epileptic syndromes, not intractable, without status epilepticus: Secondary | ICD-10-CM

## 2017-10-04 LAB — ALT: ALT: 10 U/L (ref 9–46)

## 2017-10-04 LAB — CBC WITH DIFFERENTIAL/PLATELET
BASOS PCT: 0.1 %
Basophils Absolute: 7 cells/uL (ref 0–200)
Eosinophils Absolute: 7 cells/uL — ABNORMAL LOW (ref 15–500)
Eosinophils Relative: 0.1 %
HCT: 43.4 % (ref 38.5–50.0)
HEMOGLOBIN: 14.5 g/dL (ref 13.2–17.1)
Lymphs Abs: 2338 cells/uL (ref 850–3900)
MCH: 30.5 pg (ref 27.0–33.0)
MCHC: 33.4 g/dL (ref 32.0–36.0)
MCV: 91.2 fL (ref 80.0–100.0)
MONOS PCT: 8.7 %
MPV: 10.9 fL (ref 7.5–12.5)
NEUTROS ABS: 3765 {cells}/uL (ref 1500–7800)
Neutrophils Relative %: 56.2 %
PLATELETS: 174 10*3/uL (ref 140–400)
RBC: 4.76 10*6/uL (ref 4.20–5.80)
RDW: 12.5 % (ref 11.0–15.0)
TOTAL LYMPHOCYTE: 34.9 %
WBC mixed population: 583 cells/uL (ref 200–950)
WBC: 6.7 10*3/uL (ref 3.8–10.8)

## 2017-10-04 MED ORDER — LAMOTRIGINE 100 MG PO TABS: ORAL_TABLET | ORAL | 5 refills | Status: DC

## 2017-10-04 MED ORDER — LEVETIRACETAM 100 MG/ML PO SOLN: ORAL | 5 refills | Status: DC

## 2017-10-04 MED ORDER — DEPAKOTE SPRINKLES 125 MG PO CSDR: DELAYED_RELEASE_CAPSULE | ORAL | 5 refills | Status: DC

## 2017-10-04 NOTE — Patient Instructions (Signed)
Thank you for coming in today.   Instructions for you until your next appointment are as follows: 1. Continue Casey Bryant's medication as you have been giving it 2. Let me know if he has any seizures 3. Continue to monitor his diet and exercise. I am concerned that he has gained some weight.  Wt Readings from Last 3 Encounters:  10/04/17 177 lb 3.2 oz (80.4 kg)  08/23/16 171 lb 15.3 oz (78 kg)  08/24/15 167 lb 9.6 oz (76 kg)    Please plan to return for follow up in one year or sooner if needed.

## 2017-10-04 NOTE — Progress Notes (Signed)
Patient: Casey Bryant MRN: [id number] Sex: male DOB: 10/04/1990  Provider: Elveria Rising, NP Location of Care: [facility] Child Neurology  Note type: Routine return visit  History of Present Illness: Referral Source: [name], FNP History from: [facility] chart and Mom Chief Complaint: Seizures  Casey Bryant is a 27 y.o. man with history of autism and seizure disorder. He was last seen Aug 23, 2016. Casey Bryant is taking and tolerating Lamotrigine, Levetiracetam and Depakote Sprinkles and has remained seizure free since January 2016.    Casey Bryant has had difficulty in the past with weight gain but is appetite has improved over time. He has gained 6 lbs since his last visit. Casey Bryant also has trouble with insomnia but Mom says that it is manageable for the most part. There are no problems with behavior.   Casey Bryant has been otherwise generally healthy since he was last seen. His mother has no other health concerns for him today other than previously mentioned.  Review of Systems: Please see the HPI for neurologic and other pertinent review of systems. Otherwise, all other systems were reviewed and were negative.    Past Medical History:  Diagnosis Date  . Seizures (HCC)    Hospitalizations: No., Head Injury: No., Nervous System Infections: No., Immunizations up to date: Yes.   Past Medical History Comments: He has twin brother with autism and seizures   Surgical History Past Surgical History:  Procedure Laterality Date  . CIRCUMCISION  1990    Family History family history includes Cancer in his mother. Family History is otherwise negative for migraines, seizures, cognitive impairment, blindness, deafness, birth defects, chromosomal disorder, autism.  Social History Social History   Socioeconomic History  . Marital status: Single    Spouse name: Not on file  . Number of children: Not on file  . Years of education: Not on file  . Highest education level: Not on file    Occupational History  . Not on file  Social Needs  . Financial resource strain: Not on file  . Food insecurity:    Worry: Not on file    Inability: Not on file  . Transportation needs:    Medical: Not on file    Non-medical: Not on file  Tobacco Use  . Smoking status: Never Smoker  . Smokeless tobacco: Never Used  Substance and Sexual Activity  . Alcohol use: No  . Drug use: No  . Sexual activity: Never  Lifestyle  . Physical activity:    Days per week: Not on file    Minutes per session: Not on file  . Stress: Not on file  Relationships  . Social connections:    Talks on phone: Not on file    Gets together: Not on file    Attends religious service: Not on file    Active member of club or organization: Not on file    Attends meetings of clubs or organizations: Not on file    Relationship status: Not on file  Other Topics Concern  . Not on file  Social History Narrative   His biologic mother is deceased. He lives with foster mother and twin brother, who also has autism and seizures.    Allergies No Known Allergies  Physical Exam BP 122/80   Pulse 80   Ht 5\' 6"  (1.676 m)   Wt 177 lb 3.2 oz (80.4 kg)   BMI 28.60 kg/m  General: well developed, well nourished male, seated in exam room, in no evident distress;  black hair, brown eyes, appears to be right handed.  Head: normocephalic and atraumatic. Oropharynx benign. No dysmorphic features. Neck: supple with no carotid bruits. No focal tenderness. Cardiovascular: regular rate and rhythm, no murmurs. Respiratory: Clear to auscultation bilaterally Abdomen: Bowel sounds present all four quadrants, abdomen soft, non-tender, non-distended.  Musculoskeletal: No skeletal deformities or obvious scoliosis Skin: no rashes or neurocutaneous lesions  Neurologic Exam Mental Status: Awake and fully alert.  Had intermittent flapping motions with his hands at times. He has no language. He was able to follow some simple commands and  tolerated invasions into his space fairly well. He smiled at his mother at times.  Cranial Nerves: Fundoscopic exam - red reflex present.  Unable to fully visualize fundus.  Pupils equal briskly reactive to light.  Extraocular movements and visual fields appear full but he had difficulty following instructions for testing. Turned to localize sounds.  Facial sensation intact.  Face, tongue, palate move normally and symmetrically.  Neck flexion and extension normal. Motor: Normal functional bulk, tone and strength Sensory: Withdrawal x 4 Coordination: Unable to adequately assess due to his inability to cooperate. No dysmetria when reaching for objects.  Gait and Station: Arises from chair, without difficulty. Stance is wide based.  Gait demonstrates normal stride length and balance.  Reflexes: Diminished and symmetric. Toes downgoing. No clonus.   Impression 1.  Generalized convulsive epilepsy 2.  Autism 3.  Insomnia   Recommendations for plan of care The patient's previous [facility] records were reviewed. Hendrick has neither had nor required imaging or lab studies since the last visit. He is a 27 year old man with history of autism, seizure disorder and insomnia. He is taking and tolerating Lamotrigine, Levetiracetam and Depakote Sprinkles and has remained seizure free since January 2016. He will continue these medications without change. I asked his mother to let me know if Casey Bryant has any seizures. Suleyman has not had surveillance CBC or ALT drawn in some time. I gave Mom lab orders for that and will call her when I receive the results.   Casey Bryant has gained some weight since his last visit and I talked with Mom about that. She is careful about portion sizes and snacks, and feels that he is active in his day program. We will continue to monitor his weight.  Wt Readings from Last 3 Encounters:  10/04/17 177 lb 3.2 oz (80.4 kg)  08/23/16 171 lb 15.3 oz (78 kg)  08/24/15 167 lb 9.6 oz (76 kg)   The  medication list was reviewed and reconciled.  No changes were made in the prescribed medications today.  A complete medication list was provided to his mother.  Allergies as of 10/04/2017   No Known Allergies     Medication List        Accurate as of 10/04/17  9:20 AM. Always use your most recent med list.          DEPAKOTE SPRINKLES 125 MG capsule Generic drug:  divalproex TAKE 5 CAPSULES BY MOUTH EVERY MORNING TAKE 6 CAPSULES BY MOUTH EVERY EVENING   lamoTRIgine 100 MG tablet Commonly known as:  LAMICTAL TAKE 1 TABLET BY MOUTH IN THE MORNING AND 1 TABLET AT NIGHT   levETIRAcetam 100 MG/ML solution Commonly known as:  KEPPRA TAKE 1 & 1/2 TEASPOONFULS (7.5 ML ) BY MOUTH 2 TIMES A DAY   multivitamin with minerals tablet Take 1 tablet by mouth daily.       Total time spent with the patient was  20 minutes, of which 50% or more was spent in counseling and coordination of care.   Elveria Risingina Jadriel Saxer NP-C

## 2018-03-21 ENCOUNTER — Other Ambulatory Visit (INDEPENDENT_AMBULATORY_CARE_PROVIDER_SITE_OTHER): Payer: Self-pay | Admitting: Family

## 2018-03-21 DIAGNOSIS — G40309 Generalized idiopathic epilepsy and epileptic syndromes, not intractable, without status epilepticus: Secondary | ICD-10-CM

## 2018-04-02 ENCOUNTER — Other Ambulatory Visit (INDEPENDENT_AMBULATORY_CARE_PROVIDER_SITE_OTHER): Payer: Self-pay | Admitting: Family

## 2018-04-02 DIAGNOSIS — G40309 Generalized idiopathic epilepsy and epileptic syndromes, not intractable, without status epilepticus: Secondary | ICD-10-CM

## 2018-04-02 MED ORDER — LAMOTRIGINE 100 MG PO TABS: ORAL_TABLET | ORAL | 5 refills | Status: DC

## 2018-04-02 NOTE — Addendum Note (Signed)
Addended by: Harland DingwallMITCHELL, Caira Poche A on: 04/02/2018 09:53 AM   Modules accepted: Orders

## 2018-04-02 NOTE — Telephone Encounter (Signed)
°  Who's calling (name and relationship to patient) : [name], guardian Best contact number: [phone] Provider they see: Elveria Risingina Goodpasture Reason for call:     PRESCRIPTION REFILL ONLY  Name of prescription: Lamotrigine  [facility] [address]

## 2018-09-20 ENCOUNTER — Other Ambulatory Visit (INDEPENDENT_AMBULATORY_CARE_PROVIDER_SITE_OTHER): Payer: Self-pay | Admitting: Family

## 2018-09-20 DIAGNOSIS — G40309 Generalized idiopathic epilepsy and epileptic syndromes, not intractable, without status epilepticus: Secondary | ICD-10-CM

## 2018-10-21 ENCOUNTER — Other Ambulatory Visit (INDEPENDENT_AMBULATORY_CARE_PROVIDER_SITE_OTHER): Payer: Self-pay | Admitting: Family

## 2018-10-21 DIAGNOSIS — G40309 Generalized idiopathic epilepsy and epileptic syndromes, not intractable, without status epilepticus: Secondary | ICD-10-CM

## 2018-10-21 NOTE — Telephone Encounter (Signed)
Message sent to front desk staff to contact family to schedule f/u appointment.

## 2018-11-18 ENCOUNTER — Other Ambulatory Visit: Payer: Self-pay

## 2018-11-18 ENCOUNTER — Ambulatory Visit (INDEPENDENT_AMBULATORY_CARE_PROVIDER_SITE_OTHER): Payer: Medicare Other | Admitting: Family

## 2018-11-18 ENCOUNTER — Encounter (INDEPENDENT_AMBULATORY_CARE_PROVIDER_SITE_OTHER): Payer: Self-pay | Admitting: Family

## 2018-11-18 DIAGNOSIS — G40309 Generalized idiopathic epilepsy and epileptic syndromes, not intractable, without status epilepticus: Secondary | ICD-10-CM

## 2018-11-18 NOTE — Progress Notes (Signed)
This is a Pediatric Specialist E-Visit follow up consult provided WebEx Casey Bryant and their parent/guardian Casey Bryant consented to an E-Visit consult today.  Location of patient: Casey Bryant is with mom Location of provider: Elveria Bryant Harsimran Westman, NP is in office Patient was referred by Casey Bryant   The following participants were involved in this E-Visit: mom, CMA ,provider  Chief Complain/ Reason for E-Visit today: Epilepsy Total time on call: 10 min Follow up: 1 year     Casey Bryant   MRN:  [id number]  1990/05/17   Provider: Elveria Bryant Macey Wurtz Bryant Location [facility] Care[facility]  Visit type: Telehealth  Last visit: 10/04/17  Referral source: Harrietta GuardianSarah Bailey, Bryant History from: mom and chcn chart  Brief history:  History of autism and seizure disorder. He is taking and tolerating Depakote, Levetiracetam and Lamotrigine for generalized tonic-clonic seizures and has remained seizure free for January 2016.  He has had a problem with weight gain in the past.    Today's concerns: Mom reports that Casey Bryant has remained seizure free since his last visit. He has been restless recently because of shelter in place order due to Covid 19 pandemic but in the last month his day program has reopened. Mom says that his mood has improved with being able to leave the house a few days per week and do activities. She said that she has worked to limit snacks and portion sizes while they were at home and that Casey Bryant has lost a few pounds.   Mom says that Casey Bryant has been otherwise generally healthy since he was last seen. She has no other health concerns for him today other than previously mentioned.    Review of systems: Please see HPI for neurologic and other pertinent review of systems. Otherwise all other systems were reviewed and were negative.  Problem List: Patient Active Problem List   Diagnosis Date Noted  . Overweight (BMI 25.0-29.9) 09/05/2013  . Active autistic  disorder 08/08/2012  . Generalized convulsive epilepsy (HCC) 08/08/2012  . Insomnia 08/08/2012  . Encounter for long-term (current) use of other medications 08/08/2012     Past Medical History:  Diagnosis Date  . Seizures (HCC)     Past medical history comments: See HPI Copied from previous record: He has a twin brother with autism and seizures  Surgical history: Past Surgical History:  Procedure Laterality Date  . CIRCUMCISION  1990     Family history: family history includes Cancer in his mother.   Social history: Social History   Socioeconomic History  . Marital status: Single    Spouse name: Not on file  . Number of children: Not on file  . Years of education: Not on file  . Highest education level: Not on file  Occupational History  . Not on file  Social Needs  . Financial resource strain: Not on file  . Food insecurity    Worry: Not on file    Inability: Not on file  . Transportation needs    Medical: Not on file    Non-medical: Not on file  Tobacco Use  . Smoking status: Never Smoker  . Smokeless tobacco: Never Used  Substance and Sexual Activity  . Alcohol use: No  . Drug use: No  . Sexual activity: Never  Lifestyle  . Physical activity    Days per week: Not on file    Minutes per session: Not on file  . Stress: Not on file  Relationships  . Social  connections    Talks on phone: Not on file    Gets together: Not on file    Attends religious service: Not on file    Active member of club or organization: Not on file    Attends meetings of clubs or organizations: Not on file    Relationship status: Not on file  . Intimate partner violence    Fear of current or ex partner: Not on file    Emotionally abused: Not on file    Physically abused: Not on file    Forced sexual activity: Not on file  Other Topics Concern  . Not on file  Social History Narrative   His biologic mother is deceased. He lives with foster mother and twin brother, who also  has autism and seizures.     Past/failed meds:  Allergies: No Known Allergies    Immunizations:  There is no immunization history on file for this patient.    Impression: 1. Generalized convulsive epilepsy 2. Autism  3. Obesity   Recommendations for plan of care: The patient's previous [facility] records were reviewed. [name] has neither had nor required imaging or lab studies since the last visit. He is a 28 year old man with history of autism, generalized convulsive epilepsy, and obesity. He has remained seizure free on Depakote, Levetiracetam and Lamotrigine since January 2016 and will continue on these medications for the foreseeable future. Mom is doing a good job with managing this intake and limiting weight gain. I commended Mom for her work with [employer]. I will see him back in follow up in 1 year or sooner if needed.   The medication list was reviewed and reconciled. No changes were made in the prescribed medications today. A complete medication list was provided to the patient.  Allergies as of 11/18/2018   No Known Allergies     Medication List       Accurate as of November 18, 2018 10:41 AM. If you have any questions, ask your nurse or doctor.        Depakote Sprinkles 125 MG capsule Generic drug: divalproex TAKE 5 CAPSULES BY MOUTH EVERY MORNING AND 6 CAPS BY MOUTH EVERY EVENING   hydrOXYzine 50 MG tablet Commonly known as: ATARAX/VISTARIL TAKE ONE TO TWO TABLETS 30 MINUTES BEFORE BEDTIME FOR SLEEP.   lamoTRIgine 100 MG tablet Commonly known as: LAMICTAL TAKE 1 TABLET BY MOUTH IN THE MORNING AND 1 TABLET AT NIGHT   lamoTRIgine 100 MG tablet Commonly known as: LAMICTAL TAKE 1 TABLET BY MOUTH IN THE MORNING AND 1 TABLET AT NIGHT   levETIRAcetam 100 MG/ML solution Commonly known as: KEPPRA TAKE 1 & 1/2 TEASPOONFULS (7.5 ML ) BY MOUTH 2 TIMES A DAY   multivitamin with minerals tablet Take 1 tablet by mouth daily.      Total time spent on the phone with the  patient's mother was 10 minutes, of which 50% or more was spent in counseling and coordination of care.  Casey Germany [facility] Ph. [phone] Fax [phone]

## 2018-11-19 ENCOUNTER — Encounter (INDEPENDENT_AMBULATORY_CARE_PROVIDER_SITE_OTHER): Payer: Self-pay | Admitting: Family

## 2018-11-19 MED ORDER — DEPAKOTE SPRINKLES 125 MG PO CSDR: DELAYED_RELEASE_CAPSULE | ORAL | 5 refills | Status: DC

## 2018-11-19 MED ORDER — LEVETIRACETAM 100 MG/ML PO SOLN: ORAL | 5 refills | Status: DC

## 2018-11-19 MED ORDER — LAMOTRIGINE 100 MG PO TABS: ORAL_TABLET | ORAL | 5 refills | Status: DC

## 2018-11-19 NOTE — Patient Instructions (Signed)
Thank you for talking with me by phone today.   Instructions for you until your next appointment are as follows: 1. Continue Sovereign's medicines as you have been giving them.  2. Let me know if he has any seizures 3. Please plan to return for follow up in one year or sooner if needed.

## 2019-04-21 ENCOUNTER — Other Ambulatory Visit (INDEPENDENT_AMBULATORY_CARE_PROVIDER_SITE_OTHER): Payer: Self-pay | Admitting: Family

## 2019-04-21 DIAGNOSIS — G40309 Generalized idiopathic epilepsy and epileptic syndromes, not intractable, without status epilepticus: Secondary | ICD-10-CM

## 2019-04-21 NOTE — Telephone Encounter (Signed)
Please send to the pharmacy °

## 2019-04-25 ENCOUNTER — Other Ambulatory Visit (INDEPENDENT_AMBULATORY_CARE_PROVIDER_SITE_OTHER): Payer: Self-pay | Admitting: Family

## 2019-04-25 DIAGNOSIS — G40309 Generalized idiopathic epilepsy and epileptic syndromes, not intractable, without status epilepticus: Secondary | ICD-10-CM

## 2019-04-25 MED ORDER — DEPAKOTE SPRINKLES 125 MG PO CSDR: DELAYED_RELEASE_CAPSULE | ORAL | 5 refills | Status: DC

## 2019-06-02 ENCOUNTER — Encounter (INDEPENDENT_AMBULATORY_CARE_PROVIDER_SITE_OTHER): Payer: Self-pay | Admitting: Family

## 2019-06-02 ENCOUNTER — Ambulatory Visit (INDEPENDENT_AMBULATORY_CARE_PROVIDER_SITE_OTHER): Payer: Medicare Other | Admitting: Family

## 2019-06-02 ENCOUNTER — Other Ambulatory Visit: Payer: Self-pay

## 2019-06-02 VITALS — BP 110/70 | HR 80 | Ht 67.0 in | Wt 177.2 lb

## 2019-06-02 DIAGNOSIS — F840 Autistic disorder: Secondary | ICD-10-CM | POA: Diagnosis not present

## 2019-06-02 DIAGNOSIS — G40309 Generalized idiopathic epilepsy and epileptic syndromes, not intractable, without status epilepticus: Secondary | ICD-10-CM | POA: Diagnosis not present

## 2019-06-02 MED ORDER — LAMOTRIGINE 100 MG PO TABS: ORAL_TABLET | ORAL | 5 refills | Status: DC

## 2019-06-02 MED ORDER — LEVETIRACETAM 100 MG/ML PO SOLN: ORAL | 5 refills | Status: DC

## 2019-06-02 MED ORDER — DEPAKOTE SPRINKLES 125 MG PO CSDR: DELAYED_RELEASE_CAPSULE | ORAL | 5 refills | Status: DC

## 2019-06-02 NOTE — Progress Notes (Signed)
Casey NIEBLAS   MRN:  [id number]  January 19, 1991   Provider: Rockwell Germany NP-C Location of Care: [facility] Child Neurology  Visit type: Routine visit  Last visit: 11/18/2018  Referral source: Gwenlyn Perking, FNP History from: mother, patient, and chcn chart  Brief history:  Copied from previous record: History of autism and seizure disorder. He is taking and tolerating Depakote, Levetiracetam and Lamotrigine for generalized tonic-clonic seizures and has remained seizure free for January 2016.  He has had a problem with weight gain in the past.   Today's concerns: Mom reports today that Casey Bryant had been doing fairly well until about 2 weeks ago. On Sunday morning he was sitting at the table eating cereal when he suddenly began perspiring profusely. His father gave him some Pedialyte and the sweating stopped. He had no further episodes until the following Sunday when he was again eating cereal and began sweating profusely. His mother gave him cranberry juice and he began to improve. Mom has a glucometer and checked his blood sugar after drinking the juice and the blood glucose was 70. She said that on that day he had a brief full body jerk while sweating, but that he has had no obvious seizure activity. Mom took him to see his PCP who performed lab studies. The results were all within normal limits but it should be noted that the non-fasting glucose was 78. The antiepileptic medications were random levels and were as follows: Lamotrigine level was 12.4 mcg/ml, the Levetiracetam level was 15.6 mcg/ml and the Depakote level was 109 mcg/ml.  Mom reports that Casey Bryant tested positive for Covid 20 in late December, as did his twin brother and both parents. Mom said that Casey Bryant and his brother were fairly asymptomatic but that she was fairly ill and that Dad required hospitalization for 5 days.   Casey Bryant has been otherwise generally healthy. Mom has no other health concerns for him today other than  previously mentioned.   Review of systems: Please see HPI for neurologic and other pertinent review of systems. Otherwise all other systems were reviewed and were negative.  Problem List: Patient Active Problem List   Diagnosis Date Noted  . Overweight (BMI 25.0-29.9) 09/05/2013  . Active autistic disorder 08/08/2012  . Generalized convulsive epilepsy ([place]) 08/08/2012  . Insomnia 08/08/2012  . Encounter for long-term (current) use of other medications 08/08/2012     Past Medical History:  Diagnosis Date  . Seizures ([place])     Past medical history comments: See HPI Copied from previous record: He has a twin brother with autism and seizures  Surgical history: Past Surgical History:  Procedure Laterality Date  . CIRCUMCISION  1990     Family history: family history includes Cancer in his mother.   Social history: Social History   Socioeconomic History  . Marital status: Single    Spouse name: Not on file  . Number of children: Not on file  . Years of education: Not on file  . Highest education level: Not on file  Occupational History  . Not on file  Tobacco Use  . Smoking status: Never Smoker  . Smokeless tobacco: Never Used  Substance and Sexual Activity  . Alcohol use: No  . Drug use: No  . Sexual activity: Never  Other Topics Concern  . Not on file  Social History Narrative   His biologic mother is deceased. He lives with foster mother and twin brother, who also has autism and seizures.   Social Determinants  of Health   Financial Resource Strain:   . Difficulty of Paying Living Expenses: Not on file  Food Insecurity:   . Worried About Programme researcher, broadcasting/film/video in the Last Year: Not on file  . Ran Out of Food in the Last Year: Not on file  Transportation Needs:   . Lack of Transportation (Medical): Not on file  . Lack of Transportation (Non-Medical): Not on file  Physical Activity:   . Days of Exercise per Week: Not on file  . Minutes of Exercise per  Session: Not on file  Stress:   . Feeling of Stress : Not on file  Social Connections:   . Frequency of Communication with Friends and Family: Not on file  . Frequency of Social Gatherings with Friends and Family: Not on file  . Attends Religious Services: Not on file  . Active Member of Clubs or Organizations: Not on file  . Attends Banker Meetings: Not on file  . Marital Status: Not on file  Intimate Partner Violence:   . Fear of Current or Ex-Partner: Not on file  . Emotionally Abused: Not on file  . Physically Abused: Not on file  . Sexually Abused: Not on file    Past/failed meds:   Allergies: No Known Allergies    Immunizations:  There is no immunization history on file for this patient.    Diagnostics/Screenings:   Physical Exam: BP 110/70   Pulse 80   Ht 5\' 7"  (1.702 m)   Wt 177 lb 3.2 oz (80.4 kg)   BMI 27.75 kg/m   General: well developed, well nourished man, seated in exam room, in no evident distress; black hair, brown eyes, appears to be right handed Head: normocephalic and atraumatic. Oropharynx benign. No dysmorphic features. Neck: supple with no carotid bruits. Cardiovascular: regular rate and rhythm, no murmurs. Respiratory: Clear to auscultation bilaterally Abdomen: Bowel sounds present all four quadrants, abdomen soft, non-tender, non-distended. No hepatosplenomegaly or masses palpated. Musculoskeletal: No skeletal deformities or obvious scoliosis.  Skin: no rashes or neurocutaneous lesions. Has thickened skin on his left hand from biting his hand.  Neurologic Exam Mental Status: Awake and fully alert. Has intermittent flapping of his hands. He has no language.  Smiles at times. He was able to follow simple commands and tolerated invasions into his space fairly well.  Cranial Nerves: Fundoscopic exam - red reflex present.  Unable to fully visualize fundus.  Pupils equal briskly reactive to light.  Turns to localize faces and objects in  the periphery. Turns to localize sounds in the periphery. Facial movements are symmetric.  Neck flexion and extension normal. Motor: Normal functional bulk, tone and strength Sensory: Withdrawal x 4 Coordination: Unable to adequately assess due to patient's inability to participate in examination. No dysmetria when reaching for objects. Gait and Station: Able to stand and bear weight independently. Normal stride length and balance. Reflexes: Diminished and symmetric. Toes neutral. No clonus  Impression: 1. Generalized convulsive epilepsy 2. Autism 3. Obesity   Recommendations for plan of care: The patient's previous [facility] records were reviewed. Tradarius has neither had nor required imaging or lab studies since the last visit, other than what was performed at his PCP office last week. He is a 29 year old man with history of autism and generalized convulsive epilepsy. He had 2 episodes a week apart of perspiring profusely. He had one jerking movement during one of the episodes but has had no overt seizure activity. Drinking juice improved the  sweating and his blood sugar was noted to be 70 once after drinking juice. I talked with Mom and told her that the episodes do not sound consistent with seizures. I am concerned that he may be having bouts of hypoglycemia. I recommended that Mom give him a protein snack just before bedtime and that she check his blood sugar if he has similar symptoms in the future. I reviewed the lab results performed at the PCP office with Mom and told her that there is no need to make changes in his antiepileptic medications at this time. I will otherwise see Casey Bryant back in follow up in 6 months or sooner if needed. Mom agreed with the plans made today.   The medication list was reviewed and reconciled. No changes were made in the prescribed medications today. A complete medication list was provided to the patient.  Allergies as of 06/02/2019   No Known Allergies     Medication  List       Accurate as of June 02, 2019 10:06 AM. If you have any questions, ask your nurse or doctor.        Depakote Sprinkles 125 MG capsule Generic drug: divalproex TAKE 5 CAPSULES BY MOUTH EVERY MORNING AND 6 CAPSULES BY MOUTH EVERY EVENING   hydrOXYzine 50 MG tablet Commonly known as: ATARAX/VISTARIL TAKE ONE TO TWO TABLETS 30 MINUTES BEFORE BEDTIME FOR SLEEP.   lamoTRIgine 100 MG tablet Commonly known as: LAMICTAL TAKE 1 TABLET BY MOUTH IN THE MORNING AND 1 TABLET AT NIGHT   levETIRAcetam 100 MG/ML solution Commonly known as: KEPPRA TAKE 1 & 1/2 TEASPOONFULS (7.5 ML ) BY MOUTH 2 TIMES A DAY   multivitamin with minerals tablet Take 1 tablet by mouth daily.      I consulted with Dr Sharene Skeans regarding this patient.  Total time spent with the patient was 20 minutes, of which 50% or more was spent in counseling and coordination of care.  Elveria Rising NP-C [facility] Ph. [phone] Fax [phone]

## 2019-06-02 NOTE — Patient Instructions (Signed)
Thank you for coming in today.   Instructions for you until your next appointment are as follows: 1. Continue Casey Bryant's medications as you have been giving them 2. Let me know if he has any seizures or if you have any concerns 3. Try giving him a protein based snack at bedtime. If he is having episodes of low blood sugar, that may help to keep the blood sugars more stable.  4.  Please plan to return for follow up in 6 months or sooner if needed.

## 2019-09-26 ENCOUNTER — Telehealth (INDEPENDENT_AMBULATORY_CARE_PROVIDER_SITE_OTHER): Payer: Self-pay | Admitting: Family

## 2019-09-26 NOTE — Telephone Encounter (Signed)
I called the pharmacy and was told that the Rx is ready for pick up. I called Mom and let her know. TG

## 2019-09-26 NOTE — Telephone Encounter (Signed)
  Who's calling (name and relationship to patient) : [name] (mom)  Best contact number: [phone]  Provider they see: Elveria Rising  Reason for call: Mom states that she is almost out of Lamictal for patient but the pharmacy says it is not available for refill until August. Requests call back.    PRESCRIPTION REFILL ONLY  Name of prescription: Lamictal  [facility] [address], [place]

## 2019-12-01 ENCOUNTER — Ambulatory Visit (INDEPENDENT_AMBULATORY_CARE_PROVIDER_SITE_OTHER): Payer: Medicare Other | Admitting: Family

## 2019-12-21 ENCOUNTER — Other Ambulatory Visit (INDEPENDENT_AMBULATORY_CARE_PROVIDER_SITE_OTHER): Payer: Self-pay | Admitting: Family

## 2019-12-21 DIAGNOSIS — G40309 Generalized idiopathic epilepsy and epileptic syndromes, not intractable, without status epilepticus: Secondary | ICD-10-CM

## 2020-02-02 ENCOUNTER — Encounter (INDEPENDENT_AMBULATORY_CARE_PROVIDER_SITE_OTHER): Payer: Self-pay | Admitting: Family

## 2020-02-02 NOTE — Progress Notes (Signed)
This is a Pediatric Specialist E-Visit follow up consult provided via My Chart Casey Bryant and their parent/guardian Casey Bryant consented to an E-Visit consult today.  Location of patient: [name] is at Home(location) Location of provider: Elveria Rising, NP is at Office (location) Patient was referred by Casey Amen, FNP   The following participants were involved in this E-Visit: Dashaunia Ridenhour, CMA              Elveria Rising, NP Total time on call: 10 min Follow up: 1 year  Patient: Casey Bryant MRN: [id number] Sex: male DOB: 06-05-1990   Provider: Elveria Rising NP-C Location of Care: [facility]  Visit type: Follow Up  Last visit: 06/02/2019  Referral source: [name], FNP History from: [facility] Chart and Mom  Brief history:  Copied from previous record: History of autism and seizure disorder. He is taking and tolerating Depakote, Levetiracetam and Lamotrigine for generalized tonic-clonic seizures and has remained seizure free forJanuary 2016.He has had a problem with weight gain in the past.   Today's concerns: Mom reports today that Casey Bryant has remained seizure free since February 2021 when he had an event presumed to be seizure. He has been compliant with medications and typically gets at least 8 hours of sleep each night.   Casey Bryant is attending a day program and does well in that environment. He has had some intermittent episodes of lips swelling with no obvious etiology. He has been otherwise generally healthy since he was last seen and Mom has no other health concerns for him today other than previously mentioned.   Review of systems: Please see HPI for neurologic and other pertinent review of systems. Otherwise all other systems were reviewed and were negative.  Problem List: Patient Active Problem List   Diagnosis Date Noted  . Overweight (BMI 25.0-29.9) 09/05/2013  . Active autistic disorder 08/08/2012  . 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 Insomnia 08/08/2012  . Encounter for long-term (current) use of other medications 08/08/2012     Past Medical History:  Diagnosis Date  . Seizures (HCC)     Past medical history comments: See HPI Copied from previous record: He has a twin brother with autism and seizures  Surgical history: Past Surgical History:  Procedure Laterality Date  . CIRCUMCISION  1990     Family history: family history includes Cancer in his mother.   Social history: Social History   Socioeconomic History  . Marital status: Single    Spouse name: Not on file  . Number of children: Not on file  . Years of education: Not on file  . Highest education level: Not on file  Occupational History  . Not on file  Tobacco Use  . Smoking status: Never Smoker  . Smokeless tobacco: Never Used  Substance and Sexual Activity  . Alcohol use: No  . Drug use: No  . Sexual activity: Never  Other Topics Concern  . Not on file  Social History Narrative   His biologic mother is deceased. He lives with foster mother and twin brother, who also has autism and seizures.   Social Determinants of Health   Financial Resource Strain:   . Difficulty of Paying Living Expenses: Not on file  Food Insecurity:   . Worried About Programme researcher, broadcasting/film/video in the Last Year: Not on file  . Ran Out of Food in the Last Year: Not on file  Transportation Needs:   . Lack of Transportation (Medical):  Not on file  . Lack of Transportation (Non-Medical): Not on file  Physical Activity:   . Days of Exercise per Week: Not on file  . Minutes of Exercise per Session: Not on file  Stress:   . Feeling of Stress : Not on file  Social Connections:   . Frequency of Communication with Friends and Family: Not on file  . Frequency of Social Gatherings with Friends and Family: Not on file  . Attends Religious Services: Not on file  . Active Member of Clubs or Organizations: Not on file  . Attends Tax inspector Meetings: Not on file  . Marital Status: Not on file  Intimate Partner Violence:   . Fear of Current or Ex-Partner: Not on file  . Emotionally Abused: Not on file  . Physically Abused: Not on file  . Sexually Abused: Not on file    Past/failed meds:  Allergies: No Known Allergies   Immunizations:  There is no immunization history on file for this patient.    Diagnostics/Screenings:  Physical Exam: There were no vitals taken for this visit.  General: well developed, well nourished man, walking about in his home, in no evident distress; black hair, brown eyes, right handed Head: normocephalic and atraumatic. No dysmorphic features. Neck: supple Musculoskeletal: no skeletal deformities or obvious scoliosis Skin: no rashes or neurocutaneous lesions  Neurologic Exam Mental Status: awake and fully alert.  Attention span and concentration subnormal for age. He has no language. He has hand flapping at times. Smiled at me once on the video but was otherwise not attentive to the video format. Cranial Nerves: turns to localize faces, objects and sounds in the periphery. Face moves normally and symmetrically.  Neck flexion and extension normal. Motor: normal functional bulk, tone and strength Sensory: withdrawal x 4 Coordination: unable to adequately assess due to his inability to participate in examination Gait and Station: arises from chair, without difficulty. Stance is normal.  Gait demonstrates normal stride length and balance.   Impression: 1. Generalized convulsive epilepsy 2. Autism 3. Obesity  Recommendations for plan of care: The patient's previous [facility] records were reviewed. Casey Bryant has neither had nor required imaging or lab studies since the last visit. He is a 29 year old man with history of autism, generalized convulsive epilepsy and obesity. He is taking and tolerating Depakote, Levetiracetam and Lamotrigine for his seizure disorder and has remained seizure  free since an event in February 2021. He is compliant with medication and gets at least 8 hours of sleep each night. Mom works to help him to limit portions and snacks to help him maintain a healthy weight. He will continue his medications without change for now. I asked Mom to let me know if Billey has any seizures or if she has any concerns. I will see him back in follow up in 1 year or sooner if needed. Mom agreed with the plans made today.   The medication list was reviewed and reconciled. No changes were made in the prescribed medications today. A complete medication list was provided to the patient.  Allergies as of 02/03/2020   No Known Allergies     Medication List       Accurate as of February 02, 2020  8:21 AM. If you have any questions, ask your nurse or doctor.        Depakote Sprinkles 125 MG capsule Generic drug: divalproex TAKE 5 CAPSULES BY MOUTH EVERY MORNING AND 6 CAPSULES BY MOUTH EVERY EVENING  hydrOXYzine 50 MG tablet Commonly known as: ATARAX/VISTARIL TAKE ONE TO TWO TABLETS 30 MINUTES BEFORE BEDTIME FOR SLEEP.   lamoTRIgine 100 MG tablet Commonly known as: LAMICTAL TAKE 1 TABLET BY MOUTH IN THE MORNING AND 1 TABLET AT NIGHT   levETIRAcetam 100 MG/ML solution Commonly known as: KEPPRA TAKE 1 & 1/2 TEASPOONFULS (7.5 ML ) BY MOUTH 2 TIMES A DAY   multivitamin with minerals tablet Take 1 tablet by mouth daily.       Total time spent with the patient was 10 minutes, of which 50% or more was spent in counseling and coordination of care.  Elveria Rising NP-C [facility] Ph. [phone] Fax [phone]

## 2020-02-03 ENCOUNTER — Encounter (INDEPENDENT_AMBULATORY_CARE_PROVIDER_SITE_OTHER): Payer: Self-pay | Admitting: Family

## 2020-02-03 ENCOUNTER — Telehealth (INDEPENDENT_AMBULATORY_CARE_PROVIDER_SITE_OTHER): Payer: Medicare Other | Admitting: Family

## 2020-02-03 ENCOUNTER — Other Ambulatory Visit: Payer: Self-pay

## 2020-02-03 DIAGNOSIS — G40309 Generalized idiopathic epilepsy and epileptic syndromes, not intractable, without status epilepticus: Secondary | ICD-10-CM | POA: Diagnosis not present

## 2020-02-03 DIAGNOSIS — F840 Autistic disorder: Secondary | ICD-10-CM | POA: Diagnosis not present

## 2020-02-03 MED ORDER — LEVETIRACETAM 100 MG/ML PO SOLN: ORAL | 5 refills | Status: DC

## 2020-02-03 MED ORDER — LAMOTRIGINE 100 MG PO TABS: ORAL_TABLET | ORAL | 5 refills | Status: DC

## 2020-02-03 MED ORDER — DEPAKOTE SPRINKLES 125 MG PO CSDR: DELAYED_RELEASE_CAPSULE | ORAL | 5 refills | Status: DC

## 2020-02-03 NOTE — Patient Instructions (Signed)
Thank you for meeting with me by video visit today.   Instructions for you until your next appointment are as follows: 1. Continue giving the [place] the medications as prescribed 2. Let me know if he has any seizures 3. Please sign up for MyChart if you have not done so 4. Please plan to return for follow up in one year or sooner if needed.

## 2020-02-06 ENCOUNTER — Encounter (INDEPENDENT_AMBULATORY_CARE_PROVIDER_SITE_OTHER): Payer: Self-pay | Admitting: Family

## 2020-02-26 ENCOUNTER — Other Ambulatory Visit (INDEPENDENT_AMBULATORY_CARE_PROVIDER_SITE_OTHER): Payer: Self-pay | Admitting: Family

## 2020-02-26 DIAGNOSIS — G40309 Generalized idiopathic epilepsy and epileptic syndromes, not intractable, without status epilepticus: Secondary | ICD-10-CM

## 2020-06-15 ENCOUNTER — Telehealth (INDEPENDENT_AMBULATORY_CARE_PROVIDER_SITE_OTHER): Payer: Self-pay | Admitting: Family

## 2020-06-15 NOTE — Telephone Encounter (Signed)
  Who's calling (name and relationship to patient) :[name] ( mom)  Best contact [phone]  Provider they NRW:[facility]   Reason for call: mom needing a new med Auth form to be faxed over for patient. I do not see in the file a med release form so I have her filling one out and emailing it back to me      PRESCRIPTION REFILL ONLY  Name of prescription:  Pharmacy:

## 2020-06-16 NOTE — Telephone Encounter (Signed)
Mom called to ask if paperwork was received. Mom just faxed it over. Mom would like to know if she can pick it up today.

## 2020-06-16 NOTE — Telephone Encounter (Signed)
I called and talked with Mom. She asked for me to fax the form to [phone] which I will do. TG

## 2020-06-16 NOTE — Telephone Encounter (Signed)
Spoke to mom and she is going to drop off the form today

## 2020-06-17 ENCOUNTER — Other Ambulatory Visit (INDEPENDENT_AMBULATORY_CARE_PROVIDER_SITE_OTHER): Payer: Self-pay | Admitting: Family

## 2020-06-17 DIAGNOSIS — G40309 Generalized idiopathic epilepsy and epileptic syndromes, not intractable, without status epilepticus: Secondary | ICD-10-CM

## 2020-07-27 DIAGNOSIS — L609 Nail disorder, unspecified: Secondary | ICD-10-CM | POA: Insufficient documentation

## 2020-08-06 ENCOUNTER — Ambulatory Visit: Payer: Medicare Other | Admitting: Podiatry

## 2020-08-06 ENCOUNTER — Other Ambulatory Visit: Payer: Self-pay

## 2020-08-06 ENCOUNTER — Encounter: Payer: Self-pay | Admitting: Podiatry

## 2020-08-06 ENCOUNTER — Ambulatory Visit (INDEPENDENT_AMBULATORY_CARE_PROVIDER_SITE_OTHER): Payer: Medicare Other | Admitting: Podiatry

## 2020-08-06 DIAGNOSIS — M79675 Pain in left toe(s): Secondary | ICD-10-CM

## 2020-08-06 DIAGNOSIS — B351 Tinea unguium: Secondary | ICD-10-CM | POA: Diagnosis not present

## 2020-08-06 DIAGNOSIS — M79674 Pain in right toe(s): Secondary | ICD-10-CM

## 2020-08-06 DIAGNOSIS — L608 Other nail disorders: Secondary | ICD-10-CM | POA: Diagnosis not present

## 2020-08-15 ENCOUNTER — Encounter (INDEPENDENT_AMBULATORY_CARE_PROVIDER_SITE_OTHER): Payer: Self-pay

## 2020-08-24 NOTE — Progress Notes (Signed)
   SUBJECTIVE Patient presents to office today complaining of elongated, thickened nails that cause pain while ambulating in shoes.  He is unable to trim his own nails. Patient is here for further evaluation and treatment.  Past Medical History:  Diagnosis Date  . Seizures (HCC)     OBJECTIVE General Patient is awake, alert, and oriented x 3 and in no acute distress. Derm Skin is dry and supple bilateral. Negative open lesions or macerations. Remaining integument unremarkable. Nails are tender, long, thickened and dystrophic with subungual debris, consistent with onychomycosis, 1-5 bilateral. No signs of infection noted.  Longitudinal hyperpigmentation also noted along the bilateral nail plates.  This present symmetrically and not atypically Vasc  DP and PT pedal pulses palpable bilaterally. Temperature gradient within normal limits.  Neuro Epicritic and protective threshold sensation grossly intact bilaterally.  Musculoskeletal Exam No symptomatic pedal deformities noted bilateral. Muscular strength within normal limits.  ASSESSMENT 1. Onychodystrophic nails 1-5 bilateral with hyperkeratosis of nails.  2. Onychomycosis of nail due to dermatophyte bilateral 3. Pain in foot bilateral  PLAN OF CARE 1. Patient evaluated today.  2. Instructed to maintain good pedal hygiene and foot care.  3. Mechanical debridement of nails 1-5 bilaterally performed using a nail nipper. Filed with dremel without incident.  4. Return to clinic in 3 mos.    Felecia Shelling, DPM [facility]  Dr. Felecia Shelling, DPM    [address] [place], [place] [address]                Office [phone]  Fax [phone]

## 2020-09-13 ENCOUNTER — Telehealth (INDEPENDENT_AMBULATORY_CARE_PROVIDER_SITE_OTHER): Payer: Self-pay | Admitting: Family

## 2020-09-13 NOTE — Telephone Encounter (Signed)
  Who's calling (name and relationship to patient) :  [name] ( mom)  Best contact number: [phone]  Provider they CBJ:[facility]   Reason for call: Mom called wanting to speak to [facility]. She asked for our email so she could send her something but she has asked for a call back please      PRESCRIPTION REFILL ONLY  Name of prescription:  Pharmacy:

## 2020-09-13 NOTE — Telephone Encounter (Signed)
I called and spoke with Mom. She needs updated [employer] order form faxed to [phone]. He is no longer taking Hydroxyzine as of 06-16-20. I completed the form and faxed it as requested. TG

## 2020-12-22 ENCOUNTER — Ambulatory Visit (INDEPENDENT_AMBULATORY_CARE_PROVIDER_SITE_OTHER): Payer: Medicare Other | Admitting: Family

## 2020-12-22 ENCOUNTER — Other Ambulatory Visit: Payer: Self-pay

## 2020-12-22 VITALS — BP 112/68 | HR 76 | Wt 199.4 lb

## 2020-12-22 DIAGNOSIS — G40309 Generalized idiopathic epilepsy and epileptic syndromes, not intractable, without status epilepticus: Secondary | ICD-10-CM

## 2020-12-22 DIAGNOSIS — F840 Autistic disorder: Secondary | ICD-10-CM

## 2020-12-24 NOTE — Progress Notes (Signed)
Casey Bryant   MRN:  [id number]  01-Nov-1990   Provider: Rockwell Germany NP-C Location of Care: [facility]  Visit type: Return visit  Last visit: 02/03/20  Referral source: Gwenlyn Perking, FNP History from: Epic chart and patient's foster mother  Brief history:  Copied from previous record: History of autism and seizure disorder. He is taking and tolerating Depakote, Levetiracetam and Lamotrigine for generalized tonic-clonic seizures and has remained seizure free since February 2021.  He has had a problem with weight gain in the past.   Today's concerns: Casey Bryant mother reports today that Casey Bryant has remained generally healthy. He and his brother recently had Covid-19 infection but weathered it well. Mom has changed him to a new day program and brought paperwork for that today.   Casey Bryant has been otherwise generally healthy since he was last seen. Mom has no other health concerns for Casey Bryant today other than previously mentioned.  Review of systems: Please see HPI for neurologic and other pertinent review of systems. Otherwise all other systems were reviewed and were negative.  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   Social history: Social History   Socioeconomic History   Marital status: Single    Spouse name: Not on file   Number of children: Not on file   Years of education: Not on file   Highest education level: Not on file  Occupational History   Not on file  Tobacco Use   Smoking status: Never   Smokeless tobacco: Never  Substance and Sexual Activity   Alcohol use: No   Drug use: No   Sexual activity: Never  Other Topics Concern   Not on file  Social History Narrative   His biologic mother is deceased. He lives with foster mother and twin brother, who also has autism and seizures.   Social Determinants of Health   Financial Resource Strain: Not on file  Food Insecurity: Not on file  Transportation Needs: Not on file  Physical Activity: Not on file  Stress: Not on file  Social Connections: Not on file  Intimate Partner Violence: Not on file    Past/failed meds:  Allergies: No Known Allergies   Immunizations: Immunization History  Administered Date(s) Administered   DTaP 04/16/1989, 06/18/1989, 08/27/1989, 05/14/1990, 02/08/1993   H1N1 01/30/2008   Hepatitis A, Ped/Adol-2 Dose 04/11/2006, 11/11/2008   Hepatitis B, ped/adol 03/27/1996, 04/30/1996, 11/10/1996   HiB (PRP-T) 06/18/1989, 08/27/1989, 11/15/1989, 05/14/1990   IPV 04/16/1989, 06/18/1989, 05/14/1990, 02/08/1993   Influenza, Seasonal, Injecte, Preservative Fre 02/02/2014, 12/22/2014, 12/28/2015   Influenza,inj,Quad PF,6+ Mos 03/20/2020   MMR 05/14/1990, 02/08/1993   Meningococcal Conjugate 04/11/2006   PFIZER(Purple Top)SARS-COV-2 Vaccination 06/22/2019, 07/12/2019, 03/20/2020   Pneumococcal Conjugate-13 02/13/1991  Tdap 04/11/2006   Varicella 11/10/1996, 04/11/2006    Diagnostics/Screenings:  Physical Exam: BP 112/68   Pulse 76   Wt 199 lb 6.4 oz (90.4 kg)   BMI 31.23 kg/m   Wt Readings from Last 3 Encounters:  12/22/20 199 lb 6.4 oz (90.4 kg)  06/02/19 177 lb 3.2 oz  (80.4 kg)  10/04/17 177 lb 3.2 oz (80.4 kg)    General: Well developed, well nourished man, seated on exam table, in no evident distress, black hair, brown eyes, right handed Head: Head normocephalic and atraumatic.  Oropharynx benign. Neck: Supple Cardiovascular: Regular rate and rhythm, no murmurs Respiratory: Breath sounds clear to auscultation Musculoskeletal: No obvious deformities or scoliosis Skin: No rashes or neurocutaneous lesions  Neurologic Exam Mental Status: Awake and fully alert.  Attention span and concentration subnormal for age. He has fiddling movements with his fingers at times. Smiled at me at times. Has no language. Cranial Nerves: Fundoscopic exam reveals red reflex. Pupils equal, briskly reactive to light. Turns to locate visual and auditory stimuli in the periphery. Facial sensation intact.  Face tongue, palate move normally and symmetrically.  Motor: Normal functional bulk, tone and strength Sensory: Withdrawal x 4 Coordination: Unable to adequately assess due to his inability to participate in examination. No dysmetria when reaching for objects.  Gait and Station: Arises from chair without difficulty.  Stance is normal. Gait demonstrates normal stride length and balance.     Impression: Generalized convulsive epilepsy ([place])  Active autistic disorder   Recommendations for plan of care: The patient's previous [facility] records were reviewed. Casey Bryant has neither had nor required imaging or lab studies since the last visit. He is a 30 year old man with history of autism and epilepsy. He is taking and tolerating Depakote, Levetiracetam and Lamotrigine, and has remained seizure free since February 2021. Casey Bryant will continue on his medications as prescribed for now. I asked his foster mother to let me know if he has any breakthrough events. Casey Bryant has gained some weight and I talked with Mom about continuing to monitor his snacks and portion sizes. I will complete the [employer] forms and mail to her. I will otherwise see Casey Bryant back in follow up in 1 year or sooner if needed. Mom agreed with the plans made today.   The medication list was reviewed and reconciled. No changes were made in the prescribed medications today. A complete medication list was provided to the patient.  Return in about 1 year (around 12/22/2021).   Allergies as of 12/22/2020   No Known Allergies      Medication List        Accurate as of December 22, 2020 11:59 PM. If you have any questions, ask your nurse or doctor.          STOP taking these medications    hydrOXYzine 50 MG tablet Commonly known as: ATARAX/VISTARIL Stopped by: Rockwell Germany, NP       TAKE these medications    Depakote Sprinkles 125 MG capsule Generic drug: divalproex TAKE 5 CAPSULES BY MOUTH EVERY MORNING AND TAKE 6 CAPSULES BY MOUTH EVERY EVENING   ergocalciferol 1.25 MG (50000 UT) capsule Commonly known as: VITAMIN D2 Take by mouth.   lamoTRIgine 100 MG tablet Commonly known as: LAMICTAL TAKE 1 TABLET BY MOUTH IN THE MORNING AND 1 TABLET AT NIGHT   levETIRAcetam 100 MG/ML solution Commonly known as: KEPPRA TAKE 1 & 1/2 TEASPOONFULS (7.5 ML ) BY MOUTH 2 TIMES A DAY  multivitamin with minerals tablet Take 1 tablet by mouth daily.       Total time spent with the patient was 20 minutes, of which 50% or more was spent in counseling and coordination of care.  Rockwell Germany NP-C [place] [facility] Ph. [phone] Fax [phone]

## 2020-12-25 ENCOUNTER — Encounter (INDEPENDENT_AMBULATORY_CARE_PROVIDER_SITE_OTHER): Payer: Self-pay | Admitting: Family

## 2020-12-25 NOTE — Patient Instructions (Signed)
Thank you for coming in today.   Instructions for you until your next appointment are as follows: Continue giving the seizure medicines as prescribed Let me know if Casey Bryant has any seizures.  Casey Bryant has gained weight since his last visit. Continue working on reducing his snacks and portion sizes.  I will complete the [employer] form and mail to you.  Please sign up for MyChart if you have not done so. Please plan to return for follow up in one year or sooner if needed.  At [facility], we are committed to providing exceptional care. You will receive a patient satisfaction survey through text or email regarding your visit today. Your opinion is important to me. Comments are appreciated.

## 2021-02-17 ENCOUNTER — Other Ambulatory Visit (INDEPENDENT_AMBULATORY_CARE_PROVIDER_SITE_OTHER): Payer: Self-pay | Admitting: Family

## 2021-02-17 DIAGNOSIS — G40309 Generalized idiopathic epilepsy and epileptic syndromes, not intractable, without status epilepticus: Secondary | ICD-10-CM

## 2021-02-17 NOTE — Telephone Encounter (Signed)
  Who's calling (name and relationship to patient) : Demetra Shiner; mom  Best contact number: [phone]  Provider they see: Dr. Blane Ohara   Reason for call: Mom has called in a refill for Depakote (the patient is out), and then stated she need a refill on all medication. Would like it filled asap.    PRESCRIPTION REFILL ONLY  Name of prescription:  Pharmacy:

## 2021-03-24 ENCOUNTER — Other Ambulatory Visit (INDEPENDENT_AMBULATORY_CARE_PROVIDER_SITE_OTHER): Payer: Self-pay | Admitting: Family

## 2021-03-24 ENCOUNTER — Telehealth (INDEPENDENT_AMBULATORY_CARE_PROVIDER_SITE_OTHER): Payer: Self-pay | Admitting: Family

## 2021-03-24 DIAGNOSIS — G40309 Generalized idiopathic epilepsy and epileptic syndromes, not intractable, without status epilepticus: Secondary | ICD-10-CM

## 2021-03-24 NOTE — Telephone Encounter (Signed)
The Rx was sent to the pharmacy electronically. TG

## 2021-03-24 NOTE — Telephone Encounter (Signed)
Who's calling (name and relationship to patient) : Demetra Shiner   Best contact number: [phone]  Provider they see: Elveria Rising  Reason for call: Is completely out of meds  Call ID:      PRESCRIPTION REFILL ONLY  Name of prescription: Lamotrigine  Pharmacy: [facility] [place]

## 2021-03-24 NOTE — Telephone Encounter (Signed)
Prescription request has been sent to [facility].

## 2021-04-27 ENCOUNTER — Telehealth (INDEPENDENT_AMBULATORY_CARE_PROVIDER_SITE_OTHER): Payer: Self-pay | Admitting: Family

## 2021-04-27 ENCOUNTER — Other Ambulatory Visit (INDEPENDENT_AMBULATORY_CARE_PROVIDER_SITE_OTHER): Payer: Self-pay | Admitting: Family

## 2021-04-27 DIAGNOSIS — G40309 Generalized idiopathic epilepsy and epileptic syndromes, not intractable, without status epilepticus: Secondary | ICD-10-CM

## 2021-04-27 MED ORDER — LEVETIRACETAM 100 MG/ML PO SOLN: ORAL | 5 refills | Status: DC

## 2021-04-27 NOTE — Telephone Encounter (Signed)
°  Who's calling (name and relationship to patient) : Mom Parent   Best contact number: [phone]  Provider they see: Dr. Rockwell Germany   Reason for call: Keppra      PRESCRIPTION REFILL ONLY  Name of prescription: [facility]  [address]

## 2021-04-27 NOTE — Telephone Encounter (Signed)
Rx sent electronically. TG 

## 2021-06-17 ENCOUNTER — Telehealth (INDEPENDENT_AMBULATORY_CARE_PROVIDER_SITE_OTHER): Payer: Self-pay | Admitting: Family

## 2021-06-17 NOTE — Telephone Encounter (Signed)
PA is pending with Medicaid and with his [employer] Part D (Medicare) insurance. TG ?

## 2021-06-17 NOTE — Telephone Encounter (Signed)
[employer] Medicare Part D insurance approved the Depakote. I called the pharmacy and left a message to let them know. TG ?

## 2021-06-17 NOTE — Telephone Encounter (Signed)
?  Who's calling (name and relationship to patient) [facility] [address] [place]  ? ?Best contact number:[phone]  ? ?Provider they GHW:[facility]  ? ?Reason for call:pharmacy called and asked if a prior auth could be faxed over for the Depakote. Pharmacy stated that the insurance will not cover it anymore.  ? ? ? ? ?PRESCRIPTION REFILL ONLY ? ?Name of prescription:Depakote  ? ?[facility]  ? ? ?

## 2021-07-28 ENCOUNTER — Ambulatory Visit: Payer: Medicare Other | Admitting: Family Medicine

## 2021-08-10 ENCOUNTER — Other Ambulatory Visit (INDEPENDENT_AMBULATORY_CARE_PROVIDER_SITE_OTHER): Payer: Self-pay | Admitting: Pediatrics

## 2021-08-10 DIAGNOSIS — G40309 Generalized idiopathic epilepsy and epileptic syndromes, not intractable, without status epilepticus: Secondary | ICD-10-CM

## 2021-12-19 ENCOUNTER — Other Ambulatory Visit (INDEPENDENT_AMBULATORY_CARE_PROVIDER_SITE_OTHER): Payer: Self-pay | Admitting: Family

## 2021-12-19 ENCOUNTER — Telehealth (INDEPENDENT_AMBULATORY_CARE_PROVIDER_SITE_OTHER): Payer: Self-pay | Admitting: Family

## 2021-12-19 DIAGNOSIS — G40309 Generalized idiopathic epilepsy and epileptic syndromes, not intractable, without status epilepticus: Secondary | ICD-10-CM

## 2021-12-19 NOTE — Telephone Encounter (Signed)
  Name of who is calling:Carol  Caller's Relationship to Patient: mom  Best contact number: [phone]  Provider they see:  Elveria Rising  Reason for call: Iris is out of his lamotrigine. She is needing a prescription sent in for that. She said he has 4 days left but she is going out of town and wants to make sure she has enough at home.      PRESCRIPTION REFILL ONLY  Name of prescription: Lamotrigine  [facility] [address]

## 2021-12-19 NOTE — Telephone Encounter (Signed)
Seen 12/2020 follow up 01/02/2022 refilled to last until appt will need further refills at appt.

## 2021-12-19 NOTE — Telephone Encounter (Signed)
Call to mom Rx sent

## 2022-01-02 ENCOUNTER — Encounter (INDEPENDENT_AMBULATORY_CARE_PROVIDER_SITE_OTHER): Payer: Self-pay | Admitting: Family

## 2022-01-02 ENCOUNTER — Ambulatory Visit (INDEPENDENT_AMBULATORY_CARE_PROVIDER_SITE_OTHER): Payer: Medicare Other | Admitting: Family

## 2022-01-02 VITALS — BP 122/70 | HR 83 | Wt 194.6 lb

## 2022-01-02 DIAGNOSIS — G40309 Generalized idiopathic epilepsy and epileptic syndromes, not intractable, without status epilepticus: Secondary | ICD-10-CM | POA: Diagnosis not present

## 2022-01-02 DIAGNOSIS — F840 Autistic disorder: Secondary | ICD-10-CM | POA: Diagnosis not present

## 2022-01-02 NOTE — Progress Notes (Unsigned)
Casey Bryant   MRN:  [id number]  [birth year]   Provider: Rockwell Germany NP-C Location of Care: [facility]  Visit type: Return visit  Last visit: 12/22/2020  Referral source: Bernerd Limbo, MD  History from: Epic chart, patient's foster mother  Brief history:  Copied from previous record: History of autism and seizure disorder. He is taking and tolerating Depakote, Levetiracetam and Lamotrigine for generalized tonic-clonic seizures and has remained seizure free since February 2021.  He has had a problem with weight gain in the past.   Today's concerns: Mom reports today that Casey Bryant has been doing well and has remained seizure free. He enjoys going to the day program and there are no problems with behavior. He and his brother had Covid-19 infections again recently but weathered it well.   Casey Bryant has been otherwise generally healthy since he was last seen. Mom has no other health concerns for him today other than previously mentioned.  Review of systems: Please see HPI for neurologic and other pertinent review of systems. Otherwise all other systems were reviewed and were negative.  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   Social history: Social History   Socioeconomic History   Marital status: Single    Spouse name: Not on file   Number of children: Not on file   Years of education: Not on file   Highest education level: Not on file  Occupational History   Not on file  Tobacco Use   Smoking status: Never   Smokeless tobacco: Never  Substance and Sexual Activity   Alcohol use: No   Drug use: No   Sexual activity: Never  Other Topics Concern   Not on file  Social History Narrative   His biologic mother is deceased. He lives with foster mother and twin brother, who also has autism and seizures.   Social Determinants of Health   Financial Resource Strain: Not on file  Food Insecurity: Not on file  Transportation Needs: Not on file  Physical Activity: Not on file  Stress: Not on file  Social Connections: Not on file  Intimate Partner Violence: Not on file    Past/failed meds:  Allergies: No Known Allergies   Immunizations: Immunization History  Administered Date(s) Administered   DTaP 04/16/1989, 06/18/1989, 08/27/1989, 05/14/1990, 02/08/1993   H1N1 01/30/2008   HIB (PRP-T) 06/18/1989, 08/27/1989, 11/15/1989, 05/14/1990   Hepatitis A, Ped/Adol-2 Dose 04/11/2006, 11/11/2008   Hepatitis B, PED/ADOLESCENT 03/27/1996, 04/30/1996, 11/10/1996   IPV 04/16/1989, 06/18/1989, 05/14/1990, 02/08/1993   Influenza, Seasonal, Injecte, Preservative Fre 02/02/2014, 12/22/2014, 12/28/2015   Influenza,inj,Quad PF,6+ Mos 03/20/2020   Influenza,inj,quad, With Preservative 02/21/1990, 03/20/1990, 02/13/1991, 01/04/2017, 02/26/2018, 01/14/2019   Influenza-Unspecified 04/29/2013   MMR  05/14/1990, 02/08/1993   Meningococcal Conjugate 04/11/2006   PFIZER(Purple Top)SARS-COV-2 Vaccination 06/22/2019, 07/12/2019, 03/20/2020   Pneumococcal Conjugate-13 02/13/1991   Tdap 04/11/2006   Varicella 11/10/1996, 04/11/2006    Diagnostics/Screenings:   Physical Exam: BP 122/70   Pulse 83   Wt 194 lb 9.6 oz (88.3 kg)   BMI 30.48 kg/m   General: well developed, well nourished man, seated in exam room, in no evident distress Head: microcephalic and atraumatic. Oropharynx benign. No dysmorphic features. Neck: supple Cardiovascular: regular rate and rhythm, no murmurs. Respiratory: clear to auscultation bilaterally Abdomen: bowel sounds present all four quadrants, abdomen soft, non-tender, non-distended.  Musculoskeletal: no skeletal deformities or obvious scoliosis.  Skin: no rashes or neurocutaneous lesions  Neurologic Exam Mental Status: awake and fully alert. Has no language.  Smiles at times. Fidgets but is able to be redirected. Tolerant of invasions into his space Cranial Nerves: fundoscopic exam - red reflex present.  Unable to fully visualize fundus.  Pupils equal briskly reactive to light.  Turns to localize faces and objects in the periphery. Turns to localize sounds in the periphery. Facial movements are symmetric. Motor: normal functional bulk, tone and strength Sensory: withdrawal x 4 Coordination: unable to adequately assess due to patient's inability to participate in examination. No dysmetria when reaching for objects. Gait and Station: normal gait  Impression: Generalized convulsive epilepsy ([place])  Active autistic disorder   Recommendations for plan of care: The patient's previous Epic records were reviewed. Casey Bryant has neither had nor required imaging or lab studies since the last visit. He has remained seizure free and is compliant with his medications. There are no problems with behavior. I asked Mom to let me know if seizures occur. She agreed with the plans made today.   The medication list was reviewed and reconciled. No changes were made in the prescribed medications today. A complete medication list was provided to the patient.  Return in about 6 months (around  07/03/2022).   Allergies as of 01/02/2022   No Known Allergies      Medication List        Accurate as of January 02, 2022 11:59 PM. If you have any questions, ask your nurse or doctor.          cetirizine 10 MG tablet Commonly known as: ZYRTEC Take 10 mg by mouth daily.   Depakote Sprinkles 125 MG capsule Generic drug: divalproex TAKE 5 CAPSULES BY MOUTH EVERY MORNING AND TAKE 6 CAPSULES BY MOUTH EVERY EVENING   lamoTRIgine 100 MG tablet Commonly known as: LAMICTAL TAKE 1 TABLET BY MOUTH EVERY MORNING AND TAKE 1 TABLET AT NIGHT   levETIRAcetam 100 MG/ML solution Commonly known as: KEPPRA TAKE 7.5 ML BY MOUTH TWICE A DAY   multivitamin with minerals tablet Take 1 tablet by mouth daily.      Total time spent with the patient was 20 minutes, of which 50% or more was spent in counseling and coordination of care.  Rockwell Germany NP-C [facility] Ph. [phone] Fax [phone]

## 2022-01-02 NOTE — Patient Instructions (Signed)
It was a pleasure to see you today!  Instructions for you until your next appointment are as follows: Continue Casey Bryant's medications as prescribed Let me know if any seizures occur Please sign up for MyChart if you have not done so. Please plan to return for follow up in 6 months or sooner if needed.  Feel free to contact our office during normal business hours at [phone] with questions or concerns. If there is no answer or the call is outside business hours, please leave a message and our clinic staff will call you back within the next business day.  If you have an urgent concern, please stay on the line for our after-hours answering service and ask for the on-call neurologist.     I also encourage you to use MyChart to communicate with me more directly. If you have not yet signed up for MyChart within [facility], the front desk staff can help you. However, please note that this inbox is NOT monitored on nights or weekends, and response can take up to 2 business days.  Urgent matters should be discussed with the on-call pediatric neurologist.   At [facility], we are committed to providing exceptional care. You will receive a patient satisfaction survey through text or email regarding your visit today. Your opinion is important to me. Comments are appreciated.

## 2022-01-05 ENCOUNTER — Encounter (INDEPENDENT_AMBULATORY_CARE_PROVIDER_SITE_OTHER): Payer: Self-pay | Admitting: Family

## 2022-01-05 MED ORDER — LAMOTRIGINE 100 MG PO TABS: ORAL_TABLET | ORAL | 5 refills | Status: DC

## 2022-01-05 MED ORDER — LEVETIRACETAM 100 MG/ML PO SOLN: ORAL | 5 refills | Status: DC

## 2022-01-05 MED ORDER — DEPAKOTE SPRINKLES 125 MG PO CSDR: DELAYED_RELEASE_CAPSULE | ORAL | 5 refills | Status: DC

## 2022-07-03 ENCOUNTER — Ambulatory Visit (INDEPENDENT_AMBULATORY_CARE_PROVIDER_SITE_OTHER): Payer: Medicare Other | Admitting: Family

## 2022-07-03 ENCOUNTER — Encounter (INDEPENDENT_AMBULATORY_CARE_PROVIDER_SITE_OTHER): Payer: Self-pay | Admitting: Family

## 2022-07-03 VITALS — BP 112/72 | HR 80 | Ht 64.57 in | Wt 193.8 lb

## 2022-07-03 DIAGNOSIS — F8189 Other developmental disorders of scholastic skills: Secondary | ICD-10-CM | POA: Diagnosis not present

## 2022-07-03 DIAGNOSIS — Z6221 Child in welfare custody: Secondary | ICD-10-CM

## 2022-07-03 DIAGNOSIS — F79 Unspecified intellectual disabilities: Secondary | ICD-10-CM

## 2022-07-03 DIAGNOSIS — F840 Autistic disorder: Secondary | ICD-10-CM | POA: Diagnosis not present

## 2022-07-03 DIAGNOSIS — G40309 Generalized idiopathic epilepsy and epileptic syndromes, not intractable, without status epilepticus: Secondary | ICD-10-CM

## 2022-07-03 MED ORDER — LEVETIRACETAM 100 MG/ML PO SOLN: ORAL | 5 refills | Status: DC

## 2022-07-03 MED ORDER — DEPAKOTE SPRINKLES 125 MG PO CSDR: DELAYED_RELEASE_CAPSULE | ORAL | 5 refills | Status: DC

## 2022-07-03 MED ORDER — LAMOTRIGINE 100 MG PO TABS: ORAL_TABLET | ORAL | 5 refills | Status: DC

## 2022-07-03 NOTE — Patient Instructions (Signed)
It was a pleasure to see you today!  Instructions for you until your next appointment are as follows: Continue Casey Bryant's medications as prescribed.  Call if seizures occur Please sign up for MyChart if you have not done so. Please plan to return for follow up in 6 months or sooner if needed.  Feel free to contact our office during normal business hours at [phone] with questions or concerns. If there is no answer or the call is outside business hours, please leave a message and our clinic staff will call you back within the next business day.  If you have an urgent concern, please stay on the line for our after-hours answering service and ask for the on-call neurologist.     I also encourage you to use MyChart to communicate with me more directly. If you have not yet signed up for MyChart within [facility], the front desk staff can help you. However, please note that this inbox is NOT monitored on nights or weekends, and response can take up to 2 business days.  Urgent matters should be discussed with the on-call pediatric neurologist.   At [facility], we are committed to providing exceptional care. You will receive a patient satisfaction survey through text or email regarding your visit today. Your opinion is important to me. Comments are appreciated.

## 2022-07-03 NOTE — Progress Notes (Unsigned)
[employer]   MRN:  [id number]  Feb 01, 1991   Provider: Rockwell Germany NP-C Location of Care: [facility]  Visit type: Return visit  Last visit: 01/02/2022  Referral source: Bernerd Limbo, MD History from: Epic chart and patient's foster mother  Brief history:  Copied from previous record: History of autism and seizure disorder. He is taking and tolerating Depakote, Levetiracetam and Lamotrigine for generalized tonic-clonic seizures and has remained seizure free since February 2021.  He has had a problem with weight gain in the past.    Today's concerns: Has remained seizure free since his last visit.  He is compliant with taking medication and generally sleeps well at night He attends a day program and does well there.  There are intermittent problems with behavior but is generally easily redirected.  Melek has been otherwise generally healthy since he was last seen. No health concerns today other than previously mentioned.  Review of systems: Please see HPI for neurologic and other pertinent review of systems. Otherwise all other systems were reviewed and were negative.  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   Social history: Social History   Socioeconomic History   Marital status: Single    Spouse name: Not on file   Number of children: Not on file   Years of education: Not on file   Highest education level: Not on file  Occupational History   Not on file  Tobacco Use   Smoking status: Never   Smokeless tobacco: Never  Substance and Sexual Activity   Alcohol use: No   Drug use: No   Sexual activity: Never  Other Topics Concern   Not on file  Social History Narrative   His biologic mother is deceased. He lives with foster mother and twin brother, who also has autism and seizures.   Social Determinants of Health   Financial Resource Strain: Not on file  Food Insecurity: Not on file  Transportation Needs: Not on file  Physical Activity: Not on file  Stress: Not on file  Social Connections: Not on file  Intimate Partner Violence: Not on file    Past/failed meds:  Allergies: No Known Allergies   Immunizations: Immunization History  Administered Date(s) Administered   DTaP 04/16/1989, 06/18/1989, 08/27/1989, 05/14/1990, 02/08/1993   H1N1 01/30/2008   HIB (PRP-T) 06/18/1989, 08/27/1989, 11/15/1989, 05/14/1990   Hepatitis A, Ped/Adol-2 Dose 04/11/2006, 11/11/2008   Hepatitis B, PED/ADOLESCENT 03/27/1996, 04/30/1996, 11/10/1996   IPV 04/16/1989, 06/18/1989, 05/14/1990, 02/08/1993   Influenza, Seasonal, Injecte, Preservative Fre 02/02/2014, 12/22/2014, 12/28/2015   Influenza,inj,Quad PF,6+ Mos 03/20/2020   Influenza,inj,quad, With Preservative 02/21/1990, 03/20/1990, 02/13/1991, 01/04/2017, 02/26/2018, 01/14/2019   Influenza-Unspecified 04/29/2013   MMR  05/14/1990, 02/08/1993   Meningococcal Conjugate 04/11/2006   PFIZER(Purple Top)SARS-COV-2 Vaccination 06/22/2019, 07/12/2019, 03/20/2020   Pneumococcal Conjugate-13 02/13/1991   Tdap 04/11/2006   Varicella 11/10/1996, 04/11/2006     Diagnostics/Screenings:  Physical Exam: BP 112/72 (BP Location: Left Arm, Patient Position: Sitting, Cuff Size: Large)   Pulse 80   Ht 5' 4.57" (1.64 m)   Wt 193 lb 12.8 oz (87.9 kg)   BMI 32.68 kg/m   General: well developed, well nourished, seated, in no evident distress Head: normocephalic and atraumatic. Oropharynx benign. No dysmorphic features. Neck: supple Cardiovascular: regular rate and rhythm, no murmurs. Respiratory: clear to auscultation bilaterally Abdomen: bowel sounds present all four quadrants, abdomen soft, non-tender, non-distended. No hepatosplenomegaly or masses palpated.Gastrostomy tube in place size *** Musculoskeletal: no skeletal deformities or obvious scoliosis. Has contractures**** Skin: no rashes or neurocutaneous lesions  Neurologic Exam Mental Status: awake and fully alert. Has no language.  Smiles responsively. Resistant to invasions into ***space Cranial Nerves: fundoscopic exam - red reflex present.  Unable to fully visualize fundus.  Pupils equal briskly reactive to light.  Turns to localize faces and objects in the periphery. Turns to localize sounds in the periphery. Facial movements are asymmetric, has lower facial weakness with drooling.  Neck flexion and extension *** abnormal with poor head control.  Motor: truncal hypotonia.  *** spastic quadriparesis  Sensory: withdrawal x 4 Coordination: unable to adequately assess due to patient's inability to participate in examination. No dysmetria when reaching for objects. Gait and Station: unable to independently stand and bear weight. Able to stand with assistance but needs constant support. Able to take a few steps but has poor balance and needs support.  Reflexes: diminished and symmetric. Toes neutral. No clonus   Impression: Generalized convulsive epilepsy ([place]) - Plan: DEPAKOTE SPRINKLES 125 MG capsule, lamoTRIgine (LAMICTAL) 100 MG tablet, levETIRAcetam (KEPPRA) 100 MG/ML solution  Active autistic  disorder  Developmental non-verbal disorder  Intellectual disability  Foster care (status)   Recommendations for plan of care: The patient's previous Epic records were reviewed. No recent diagnostic studies to be reviewed with the patient.  Plan until next visit: Continue medications as prescribed  Reminded -  Call if  No follow-ups on file.  The medication list was reviewed and reconciled. No changes were made in the prescribed medications today. A complete medication list was provided to the patient.   Allergies as of 07/03/2022   No Known Allergies      Medication List        Accurate as of July 03, 2022  2:54 PM. If you have any questions, ask your nurse or doctor.          azelastine 0.1 % nasal spray Commonly known as: ASTELIN two sprays by Both Nostrils route 2 (two) times a day as needed for Rhinitis.   cetirizine 10 MG tablet Commonly known as: ZYRTEC Take 10 mg by mouth daily.   Depakote Sprinkles 125 MG capsule Generic drug: divalproex TAKE 5 CAPSULES BY MOUTH EVERY MORNING AND TAKE 6 CAPSULES BY MOUTH EVERY EVENING   lamoTRIgine 100 MG tablet Commonly known as: LAMICTAL TAKE 1 TABLET BY MOUTH EVERY MORNING AND TAKE 1 TABLET AT NIGHT   levETIRAcetam 100 MG/ML solution Commonly known as: KEPPRA TAKE 7.5 ML BY MOUTH TWICE A DAY   melatonin 5 MG Tabs Take by mouth.   multivitamin with minerals tablet Take 1 tablet by mouth daily.   Vitamin D (Ergocalciferol) 1.25 MG (50000 UNIT) Caps capsule Commonly known  as: DRISDOL Take 50,000 Units by mouth once a week.      Total time spent with the patient was 30 minutes, of which 50% or more was spent in counseling and coordination of care.  Rockwell Germany NP-C [facility] [id number] [place], [place] [address] Ph. [phone] Fax [phone]

## 2022-07-04 DIAGNOSIS — Z6221 Child in welfare custody: Secondary | ICD-10-CM | POA: Insufficient documentation

## 2022-07-04 DIAGNOSIS — F79 Unspecified intellectual disabilities: Secondary | ICD-10-CM | POA: Insufficient documentation

## 2022-07-05 ENCOUNTER — Encounter (INDEPENDENT_AMBULATORY_CARE_PROVIDER_SITE_OTHER): Payer: Self-pay | Admitting: Family

## 2022-11-07 ENCOUNTER — Other Ambulatory Visit (INDEPENDENT_AMBULATORY_CARE_PROVIDER_SITE_OTHER): Payer: Self-pay | Admitting: Family

## 2022-11-07 DIAGNOSIS — G40309 Generalized idiopathic epilepsy and epileptic syndromes, not intractable, without status epilepticus: Secondary | ICD-10-CM

## 2023-01-04 ENCOUNTER — Encounter (INDEPENDENT_AMBULATORY_CARE_PROVIDER_SITE_OTHER): Payer: Self-pay | Admitting: Family

## 2023-01-04 ENCOUNTER — Ambulatory Visit (INDEPENDENT_AMBULATORY_CARE_PROVIDER_SITE_OTHER): Payer: Medicare Other | Admitting: Family

## 2023-01-04 VITALS — BP 122/84 | HR 86 | Wt 210.0 lb

## 2023-01-04 DIAGNOSIS — G40309 Generalized idiopathic epilepsy and epileptic syndromes, not intractable, without status epilepticus: Secondary | ICD-10-CM

## 2023-01-04 DIAGNOSIS — F8189 Other developmental disorders of scholastic skills: Secondary | ICD-10-CM | POA: Diagnosis not present

## 2023-01-04 DIAGNOSIS — Z6221 Child in welfare custody: Secondary | ICD-10-CM

## 2023-01-04 DIAGNOSIS — F840 Autistic disorder: Secondary | ICD-10-CM | POA: Diagnosis not present

## 2023-01-04 DIAGNOSIS — F79 Unspecified intellectual disabilities: Secondary | ICD-10-CM | POA: Diagnosis not present

## 2023-01-04 MED ORDER — DEPAKOTE SPRINKLES 125 MG PO CSDR: DELAYED_RELEASE_CAPSULE | ORAL | 5 refills | Status: DC

## 2023-01-04 MED ORDER — LAMOTRIGINE 100 MG PO TABS: ORAL_TABLET | ORAL | 3 refills | Status: DC

## 2023-01-04 MED ORDER — LEVETIRACETAM 100 MG/ML PO SOLN: ORAL | 5 refills | Status: DC

## 2023-01-04 NOTE — Patient Instructions (Addendum)
It was a pleasure to see you today!  Instructions for you until your next appointment are as follows: Continue medications as prescribed Let me know if seizures occur Casey Bryant has gained 17 lbs since March. Work on limiting snacks and extra portions at meals, as well as helping him to be more active Please sign up for MyChart if you have not done so. Please plan to return for follow up in 6 months  or sooner if needed.  Feel free to contact our office during normal business hours at [phone] with questions or concerns. If there is no answer or the call is outside business hours, please leave a message and our clinic staff will call you back within the next business day.  If you have an urgent concern, please stay on the line for our after-hours answering service and ask for the on-call neurologist.     I also encourage you to use MyChart to communicate with me more directly. If you have not yet signed up for MyChart within [facility], the front desk staff can help you. However, please note that this inbox is NOT monitored on nights or weekends, and response can take up to 2 business days.  Urgent matters should be discussed with the on-call pediatric neurologist.   At [facility], we are committed to providing exceptional care. You will receive a patient satisfaction survey through text or email regarding your visit today. Your opinion is important to me. Comments are appreciated.

## 2023-01-04 NOTE — Progress Notes (Signed)
Casey Bryant   MRN:  [id number]  [birth year]   Provider: Elveria Rising NP-C Location of Care: [facility]  Visit type: Return visit  Last visit: 07/03/2022  Referral source: Tracey Harries, MD History from: Epic chart and patient's foster mother  Brief history:  Copied from previous record: History of autism and seizure disorder. He is taking and tolerating Depakote, Levetiracetam and Lamotrigine for generalized tonic-clonic seizures and has remained seizure free since February 2021.  He has had a problem with weight gain in the past.   Today's concerns: Malen Gauze Mom reports that Casey Bryant has remained seizure free. He is compliant with taking medication and usually gets enough sleep Casey Bryant has gained weight since his last visit. Casey Bryant feels that he is eating more at the day program and has asked them to limit snacks there.  Behavior has not been problematic. Casey Bryant has been otherwise generally healthy since he was last seen. No health concerns today other than previously mentioned.  Review of systems: Please see HPI for neurologic and other pertinent review of systems. Otherwise all other systems were reviewed and were negative.  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   Social history: Social History   Socioeconomic History   Marital status: Single    Spouse name: Not on file   Number of children: Not on file   Years of education: Not on file   Highest education level: Not on file  Occupational History   Not on file  Tobacco Use   Smoking status: Never   Smokeless tobacco: Never  Substance and Sexual Activity   Alcohol use: No   Drug use: No   Sexual activity: Never  Other Topics Concern   Not on file  Social History Narrative   His biologic mother is deceased. He lives with foster mother and twin brother, who also has autism and seizures.   Social Determinants of Health   Financial Resource Strain: Low Risk  (08/01/2022)   Received from [facility]   Overall Financial Resource Strain (CARDIA)    Difficulty of Paying Living Expenses: Not hard at all  Food Insecurity: No Food Insecurity (08/01/2022)   Received from [facility]   Hunger Vital Sign    Worried About Running Out of Food in the Last Year: Never true    Ran Out of Food in the Last Year: Never true  Transportation Needs: No Transportation Needs (08/01/2022)   Received from [facility] - Transportation    Lack of Transportation (Medical): No    Lack of Transportation (Non-Medical): No  Physical Activity: Unknown (  08/01/2022)   Received from [facility]   Exercise Vital Sign    Days of Exercise per Week: 0 days    Minutes of Exercise per Session: Not on file  Stress: No Stress Concern Present (08/01/2022)   Received from [facility] - Occupational Stress Questionnaire    Feeling of Stress : Not at all  Social Connections: Socially Integrated (08/01/2022)   Received from [facility]   Social Network    How would you  rate your social network (family, work, friends)?: Good participation with social networks  Intimate Partner Violence: Not At Risk (08/01/2022)   Received from [facility]   HITS    Over the last 12 months how often did your partner physically hurt you?: 1    Over the last 12 months how often did your partner insult you or talk down to you?: 1    Over the last 12 months how often did your partner threaten you with physical harm?: 1    Over the last 12 months how often did your partner scream or curse at you?: 1    Past/failed meds:  Allergies: No Known Allergies   Immunizations: Immunization History  Administered Date(s) Administered   DTaP 04/16/1989, 06/18/1989, 08/27/1989, 05/14/1990, 02/08/1993   H1N1 01/30/2008   HIB (PRP-T) 06/18/1989, 08/27/1989, 11/15/1989, 05/14/1990   Hepatitis A, Ped/Adol-2 Dose 04/11/2006, 11/11/2008   Hepatitis B, PED/ADOLESCENT 03/27/1996, 04/30/1996, 11/10/1996   IPV 04/16/1989, 06/18/1989, 05/14/1990, 02/08/1993   Influenza, Seasonal, Injecte, Preservative Fre 02/02/2014, 12/22/2014, 12/28/2015   Influenza,inj,Quad PF,6+ Mos 03/20/2020   Influenza,inj,quad, With Preservative 02/21/1990, 03/20/1990, 02/13/1991, 01/04/2017, 02/26/2018, 01/14/2019   Influenza-Unspecified 04/29/2013   MMR 05/14/1990, 02/08/1993   Meningococcal Conjugate 04/11/2006   PFIZER(Purple Top)SARS-COV-2 Vaccination 06/22/2019, 07/12/2019, 03/20/2020   Pneumococcal Conjugate-13 02/13/1991   Tdap 04/11/2006   Varicella 11/10/1996, 04/11/2006    Diagnostics/Screenings:  Physical Exam: BP 122/84   Pulse 86   Wt 210 lb (95.3 kg)   BMI 35.42 kg/m  Wt Readings from Last 3 Encounters:  01/04/23 210 lb (95.3 kg)  07/03/22 193 lb 12.8 oz (87.9 kg)  01/02/22 194 lb 9.6 oz (88.3 kg)     General: Well developed, well nourished man, seated in exam room, in no evident distress Head: Head normocephalic and atraumatic.  Oropharynx benign. Neck: Supple Cardiovascular: Regular rate  and rhythm, no murmurs Respiratory: Breath sounds clear to auscultation Musculoskeletal: No obvious deformities or scoliosis Skin: No rashes or neurocutaneous lesions  Neurologic Exam Mental Status: Awake and fully alert.  Has no language. Smiles responsively. Restless at times and requires frequent redirection by his foster mother. Tolerant of invasions into his space. Cranial Nerves: Fundoscopic exam reveals sharp disc margins.  Pupils equal, briskly reactive to light.  Turns to localize faces, objects and sounds in the periphery. Facial movements are symmetric Motor: Normal functional bulk, tone and strength Sensory: Withdrawal x 4.  Coordination: No dysmetria when reaching for objects.  Gait and Station: Able to stand and walk independently Reflexes: 1+ and symmetric. Toes downgoing.   Impression: Generalized convulsive epilepsy (HCC) - Plan: DEPAKOTE SPRINKLES 125 MG capsule, lamoTRIgine (LAMICTAL) 100 MG tablet, levETIRAcetam (KEPPRA) 100 MG/ML solution  Active autistic disorder  Developmental non-verbal disorder  Intellectual disability  Foster care (status)   Recommendations for plan of care: The patient's previous Epic records were reviewed. No recent diagnostic studies to be reviewed with the patient.  Plan until next visit: Continue medications as prescribed  Work on  limiting portion sizes and snacks, as well as being more active. I am concerned about the weight gain.  Call for questions or concerns Return in about 6 months (around 07/04/2023).  The medication list was reviewed and reconciled. No changes were made in the prescribed medications today. A complete medication list was provided to the patient.  Allergies as of 01/04/2023   No Known Allergies      Medication List        Accurate as of January 04, 2023  1:10 PM. If you have any questions, ask your nurse or doctor.          azelastine 0.1 % nasal spray Commonly known as: ASTELIN two sprays by  Both Nostrils route 2 (two) times a day as needed for Rhinitis.   cetirizine 10 MG tablet Commonly known as: ZYRTEC Take 10 mg by mouth daily.   Depakote Sprinkles 125 MG capsule Generic drug: divalproex TAKE 5 CAPSULES BY MOUTH EVERY MORNING AND TAKE 6 CAPSULES BY MOUTH EVERY EVENING   lamoTRIgine 100 MG tablet Commonly known as: LAMICTAL TAKE 1 TABLET BY MOUTH EVERY MORNING AND TAKE 1 TABLET AT NIGHT   levETIRAcetam 100 MG/ML solution Commonly known as: KEPPRA TAKE 7.5 ML BY MOUTH TWICE A DAY   melatonin 5 MG Tabs Take by mouth.   multivitamin with minerals tablet Take 1 tablet by mouth daily.   Vitamin D (Ergocalciferol) 1.25 MG (50000 UNIT) Caps capsule Commonly known as: DRISDOL Take 50,000 Units by mouth once a week.      Total time spent with the patient was 20 minutes, of which 50% or more was spent in counseling and coordination of care.  Elveria Rising NP-C [facility] [address] [place], [place] [address] Ph. [phone] Fax [phone]

## 2023-06-12 ENCOUNTER — Other Ambulatory Visit (INDEPENDENT_AMBULATORY_CARE_PROVIDER_SITE_OTHER): Payer: Self-pay | Admitting: Family

## 2023-06-12 DIAGNOSIS — G40309 Generalized idiopathic epilepsy and epileptic syndromes, not intractable, without status epilepticus: Secondary | ICD-10-CM

## 2023-07-05 ENCOUNTER — Ambulatory Visit (INDEPENDENT_AMBULATORY_CARE_PROVIDER_SITE_OTHER): Payer: Medicare Other | Admitting: Family

## 2023-07-05 NOTE — Progress Notes (Deleted)
 Casey Bryant   MRN:  [id number]  [birth year]   Provider: Elveria Rising NP-C Location of Bryant: [facility]  Visit type: Return visit  Last visit: 01/04/2023  Referral source: Tracey Harries, MD History from: Epic chart and patient's foster mother  Brief history:  Copied from previous record: History of autism and seizure disorder. He is taking and tolerating Depakote, Levetiracetam and Lamotrigine for generalized tonic-clonic seizures and has remained seizure free since February 2021.  He has had a problem with weight gain in the past.   Today's concerns: He  Casey Bryant has been otherwise generally healthy since he was last seen. No health concerns today other than previously mentioned.  Review of systems: Please see HPI for neurologic and other pertinent review of systems. Otherwise all other systems were reviewed and were negative.  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   Social history: Social History   Socioeconomic History   Marital status: Single    Spouse name: Not on  file   Number of children: Not on file   Years of education: Not on file   Highest education level: Not on file  Occupational History   Not on file  Tobacco Use   Smoking status: Never   Smokeless tobacco: Never  Substance and Sexual Activity   Alcohol use: No   Drug use: No   Sexual activity: Never  Other Topics Concern   Not on file  Social History Narrative   His biologic mother is deceased. He lives with foster mother and twin brother, who also has autism and seizures.   Social Drivers of Corporate investment banker Strain: Low Risk  (08/01/2022)   [facility]   Overall Financial Resource Strain (CARDIA)    Difficulty of Paying Living Expenses: Not hard at all  Food Insecurity: No Food Insecurity (08/01/2022)   Received from [facility]   Hunger Vital Sign    Worried About Running Out of Food in the Last Year: Never true    Ran Out of Food in the Last Year: Never true  Transportation Needs: No Transportation Needs (08/01/2022)   Received from [facility] - Transportation    Lack of Transportation (Medical): No    Lack of Transportation (Non-Medical): No  Physical Activity: Unknown (08/01/2022)   Received from [facility]   Exercise Vital Sign    Days of Exercise per Week: 0 days    Minutes of Exercise per Session: Not on file  Stress: No Stress Concern Present (08/01/2022)   Received from [facility] - Occupational Stress Questionnaire    Feeling of Stress : Not at all  Social Connections: Socially Integrated (08/01/2022)   Received from [facility]   Social Network    How would you rate your social network (family, work, friends)?: Good participation with social networks  Intimate Partner Violence: Not At Risk (08/01/2022)   Received from [facility]   HITS    Over the last 12 months how often did your partner physically hurt you?: Never    Over the last 12 months how often did your  partner insult you or talk down to you?: Never    Over the last 12 months how often did your partner threaten you with physical harm?: Never    Over the last 12 months how often did your partner scream or curse at you?: Never    Past/failed meds:  Allergies: No Known Allergies   Immunizations: Immunization History  Administered Date(s) Administered   DTaP 04/16/1989, 06/18/1989, 08/27/1989, 05/14/1990, 02/08/1993   H1N1 01/30/2008   HIB (PRP-T) 06/18/1989, 08/27/1989, 11/15/1989, 05/14/1990   Hepatitis A, Ped/Adol-2 Dose 04/11/2006, 11/11/2008   Hepatitis B, PED/ADOLESCENT 03/27/1996, 04/30/1996, 11/10/1996   IPV 04/16/1989, 06/18/1989, 05/14/1990, 02/08/1993   Influenza, Seasonal, Injecte, Preservative Fre 02/02/2014, 12/22/2014, 12/28/2015   Influenza,inj,Quad PF,6+ Mos 03/20/2020   Influenza,inj,quad, With Preservative 02/21/1990, 03/20/1990, 02/13/1991, 01/04/2017, 02/26/2018, 01/14/2019   Influenza-Unspecified 04/29/2013   MMR 05/14/1990, 02/08/1993   Meningococcal Conjugate 04/11/2006   PFIZER(Purple Top)SARS-COV-2 Vaccination 06/22/2019, 07/12/2019, 03/20/2020   Pneumococcal Conjugate-13 02/13/1991   Tdap 04/11/2006   Varicella 11/10/1996, 04/11/2006    Diagnostics/Screenings:  Physical Exam: There were no vitals taken for this visit.  General: well developed, well nourished, seated, in no evident distress Head: normocephalic and atraumatic. Oropharynx difficult to examine but appears benign. No dysmorphic features. Neck: supple Cardiovascular: regular rate and rhythm, no murmurs. Respiratory: clear to auscultation bilaterally Abdomen: bowel sounds present all four quadrants, abdomen soft, non-tender, non-distended. No hepatosplenomegaly or masses palpated.Gastrostomy tube in place size  Fr cm AMT MiniOne balloon button, site clean and dry Musculoskeletal: no skeletal deformities or obvious scoliosis. Has contractures**** Skin: no rashes or neurocutaneous  lesions  Neurologic Exam Mental Status: awake and fully alert. Has no language.  Smiles responsively. Unable to follow instructions or participate in examination Cranial Nerves: fundoscopic exam - red reflex present.  Unable to fully visualize fundus.  Pupils equal briskly reactive to light.  Turns to localize faces and objects in the periphery. Turns to localize sounds in the periphery. Facial movements are asymmetric, has lower facial weakness with drooling.  Neck flexion and extension *** abnormal with poor head control.  Motor: truncal hypotonia.  *** spastic quadriparesis  Sensory: withdrawal x 4 Coordination: unable to adequately assess due to patient's inability to participate in examination. *** with reach for objects. Gait and Station: unable to independently stand and bear weight. Able to stand with assistance but needs constant support. Able to take a few steps but has poor balance and needs support.  Reflexes: diminished and symmetric. Toes neutral. No clonus   Impression: No diagnosis found.    Recommendations for plan of Bryant: The patient's previous Epic records were reviewed. No recent diagnostic studies to be reviewed with the patient.  Plan until next visit: Continue medications as prescribed  Call for questions or concerns No follow-ups on file.  The medication list was reviewed and reconciled. No changes were made in the prescribed medications today. A complete medication list was provided to the patient.  No orders of the defined types were placed in this encounter.    Allergies as of 07/05/2023   No Known Allergies      Medication List        Accurate as of July 05, 2023  7:42 AM. If you have any questions, ask your nurse or doctor.          azelastine 0.1 % nasal spray Commonly known as: ASTELIN two sprays by Both Nostrils route 2 (two) times a day as needed for Rhinitis.   cetirizine 10 MG tablet Commonly known as: ZYRTEC Take 10 mg by mouth  daily.   Depakote Sprinkles 125 MG capsule Generic drug: divalproex TAKE 5 CAPSULES BY MOUTH EVERY MORNING AND 6 CAPSULES BY MOUTH EVERY EVENING   lamoTRIgine 100 MG tablet Commonly known as: LAMICTAL TAKE 1 TABLET BY MOUTH EVERY MORNING AND TAKE 1 TABLET AT NIGHT   levETIRAcetam 100 MG/ML solution Commonly known as: KEPPRA TAKE 7.5 ML BY MOUTH TWICE A DAY   melatonin 5 MG Tabs Take by mouth.   multivitamin with minerals tablet Take 1 tablet by mouth daily.   Vitamin D (Ergocalciferol) 1.25 MG (50000 UNIT) Caps capsule Commonly known as: DRISDOL Take 50,000 Units by mouth once a week.            I discussed this patient's Bryant with the multiple providers involved in his Bryant today to develop this assessment and plan.   Total time spent with the patient was *** minutes, of which 50% or more was spent in counseling and coordination of Bryant.  Elveria Rising NP-C [facility] [address] [place], [place] [address] Ph. [phone] Fax [phone]

## 2023-10-19 ENCOUNTER — Telehealth (INDEPENDENT_AMBULATORY_CARE_PROVIDER_SITE_OTHER): Payer: Self-pay | Admitting: Family

## 2023-10-19 DIAGNOSIS — G40309 Generalized idiopathic epilepsy and epileptic syndromes, not intractable, without status epilepticus: Secondary | ICD-10-CM

## 2023-10-19 MED ORDER — DEPAKOTE SPRINKLES 125 MG PO CSDR: DELAYED_RELEASE_CAPSULE | ORAL | 0 refills | Status: DC

## 2023-10-19 NOTE — Telephone Encounter (Signed)
 Attempted to contact patients guardian.  Guardian unable to be reached. LVM to call back.  SS, CCMA

## 2023-10-19 NOTE — Telephone Encounter (Signed)
  Name of who is calling: Casey Bryant Relationship to Patient: Mom  Best contact number: [phone]  Provider they see: Ellouise Bollman   Reason for call: Mom called for a refill on prescription. She would also like a callback.      PRESCRIPTION REFILL ONLY  Name of prescription:  Pharmacy:

## 2023-10-22 ENCOUNTER — Other Ambulatory Visit (INDEPENDENT_AMBULATORY_CARE_PROVIDER_SITE_OTHER): Payer: Self-pay | Admitting: Family

## 2023-10-22 DIAGNOSIS — G40309 Generalized idiopathic epilepsy and epileptic syndromes, not intractable, without status epilepticus: Secondary | ICD-10-CM

## 2023-10-22 MED ORDER — DEPAKOTE SPRINKLES 125 MG PO CSDR: DELAYED_RELEASE_CAPSULE | ORAL | 5 refills | Status: AC

## 2023-11-21 ENCOUNTER — Encounter (INDEPENDENT_AMBULATORY_CARE_PROVIDER_SITE_OTHER): Payer: Self-pay | Admitting: Family

## 2023-11-21 ENCOUNTER — Ambulatory Visit (INDEPENDENT_AMBULATORY_CARE_PROVIDER_SITE_OTHER): Admitting: Family

## 2023-11-21 VITALS — BP 122/70 | HR 84 | Ht 65.35 in | Wt 206.0 lb

## 2023-11-21 DIAGNOSIS — Z6221 Child in welfare custody: Secondary | ICD-10-CM

## 2023-11-21 DIAGNOSIS — F79 Unspecified intellectual disabilities: Secondary | ICD-10-CM | POA: Diagnosis not present

## 2023-11-21 DIAGNOSIS — F8189 Other developmental disorders of scholastic skills: Secondary | ICD-10-CM | POA: Diagnosis not present

## 2023-11-21 DIAGNOSIS — F840 Autistic disorder: Secondary | ICD-10-CM

## 2023-11-21 DIAGNOSIS — G40309 Generalized idiopathic epilepsy and epileptic syndromes, not intractable, without status epilepticus: Secondary | ICD-10-CM | POA: Diagnosis not present

## 2023-11-21 NOTE — Patient Instructions (Signed)
 It was a pleasure to see you today!  Instructions for you until your next appointment are as follows: Try giving Casey Bryant's bedtime dose of Depakote  earlier. Start by giving it at 7pm for a few days, then try 6pm. If he continues to have problems with spitting it up at night, let me know and we will change to the sprinkle capsules.  Call if seizures occur or for any questions or concerns Please sign up for MyChart if you have not done so. Please plan to return for follow up in 6 months or sooner if needed.  Feel free to contact our office during normal business hours at [phone] with questions or concerns. If there is no answer or the call is outside business hours, please leave a message and our clinic staff will call you back within the next business day.  If you have an urgent concern, please stay on the line for our after-hours answering service and ask for the on-call neurologist.     I also encourage you to use MyChart to communicate with me more directly. If you have not yet signed up for MyChart within [facility], the front desk staff can help you. However, please note that this inbox is NOT monitored on nights or weekends, and response can take up to 2 business days.  Urgent matters should be discussed with the on-call pediatric neurologist.   At [facility], we are committed to providing exceptional care. You will receive a patient satisfaction survey through text or email regarding your visit today. Your opinion is important to me. Comments are appreciated.

## 2023-11-23 ENCOUNTER — Encounter (INDEPENDENT_AMBULATORY_CARE_PROVIDER_SITE_OTHER): Payer: Self-pay | Admitting: Family

## 2023-11-23 NOTE — Progress Notes (Signed)
 Casey Bryant   MRN:  [id number]  1990-08-26   Provider: Ellouise Bollman NP-C Location of Care: [facility]  Visit type: Return visit  Last visit: 01/04/2023  Referral source: Pura Lenis, MD History from: Epic chart and patient's foster mother  Brief history:  Copied from previous record: History of autism and seizure disorder. He is taking and tolerating Depakote , Levetiracetam  and Lamotrigine  for generalized tonic-clonic seizures and has remained seizure free since February 2021.  He has had a problem with weight gain in the past.   Today's concerns: Casey Bryant reports that he spits up the Depakote  during the night. She says that she finds the partially digested tablet in his bed frequently in the mornings. Bryant gives the Depakote  at 8AM and 8PM each day Casey Bryant is attending a day program and enjoys the activities there. He is restless today because he wants to go to that program.  Casey Bryant has been otherwise generally healthy since he was last seen. No health concerns today other than previously mentioned.  Review of systems: Please see HPI for neurologic and other pertinent review of systems. Otherwise all other systems were reviewed and were negative.  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   Social history: Social History   Socioeconomic History   Marital status: Single    Spouse name: Not on file   Number of children: Not on file   Years of education: Not on file   Highest education level: Not on file  Occupational History   Not on file  Tobacco Use   Smoking status: Never   Smokeless tobacco: Never  Substance and Sexual Activity   Alcohol use: No   Drug use: No   Sexual activity: Never  Other Topics Concern   Not on file  Social History Narrative   His biologic mother is deceased. He lives with foster mother and twin brother, who also has autism and seizures.   Social Drivers of Corporate investment banker Strain: Low Risk  (08/10/2023)   Received from [facility]   Overall Financial Resource Strain (CARDIA)    Difficulty of Paying Living Expenses: Not hard at all  Food Insecurity: No Food Insecurity (08/10/2023)   Received from [facility]   Hunger Vital Sign    Within the past 12 months, you worried that your food would run out before you got the money to buy more.: Never true    Within the past 12 months, the food you bought just didn't last and you didn't have money to get more.: Never true  Transportation Needs: No Transportation Needs (08/10/2023)   Received  from [facility]   PRAPARE - Transportation    Lack of Transportation (Medical): No    Lack of Transportation (Non-Medical): No  Physical Activity: Sufficiently Active (08/10/2023)   Received from [facility]   Exercise Vital Sign    On average, how many days per week do you engage in moderate to strenuous exercise (like a brisk walk)?: 3 days    On average, how many minutes do you engage in exercise at this level?: 60 min  Stress: Stress Concern Present (08/10/2023)   Received  from [facility] - Occupational Stress Questionnaire    Feeling of Stress : To some extent  Social Connections: Socially Integrated (08/10/2023)   Received from [facility]   Social Network    How would you rate your social network (family, work, friends)?: Good participation with social networks  Intimate Partner Violence: Not At Risk (08/10/2023)   Received from [facility]   HITS    Over the last 12 months how often did your partner physically hurt you?: Never    Over the last 12 months how often did your partner insult you or talk down to you?: Never    Over the last 12 months how often did your partner threaten you with physical harm?: Never    Over the last 12 months how often did your partner scream or curse at you?: Never    Past/failed meds:  Allergies: No Known Allergies    Immunizations: Immunization History  Administered Date(s) Administered   DTaP 04/16/1989, 06/18/1989, 08/27/1989, 05/14/1990, 02/08/1993   H1N1 01/30/2008   HIB (PRP-T) 06/18/1989, 08/27/1989, 11/15/1989, 05/14/1990   Hepatitis A, Ped/Adol-2 Dose 04/11/2006, 11/11/2008   Hepatitis B, PED/ADOLESCENT 03/27/1996, 04/30/1996, 11/10/1996   IPV 04/16/1989, 06/18/1989, 05/14/1990, 02/08/1993   Influenza, Seasonal, Injecte, Preservative Fre 02/02/2014, 12/22/2014, 12/28/2015   Influenza,inj,Quad PF,6+ Mos 03/20/2020   Influenza,inj,quad, With Preservative 02/21/1990, 03/20/1990, 02/13/1991, 01/04/2017, 02/26/2018, 01/14/2019   Influenza-Unspecified 04/29/2013   MMR 05/14/1990, 02/08/1993   Meningococcal Conjugate 04/11/2006   PFIZER(Purple Top)SARS-COV-2 Vaccination 06/22/2019, 07/12/2019, 03/20/2020   Pneumococcal Conjugate-13 02/13/1991   Tdap 04/11/2006   Varicella 11/10/1996, 04/11/2006    Diagnostics/Screenings:  Physical Exam: BP 122/70 (BP Location: Left Arm, Patient Position: Sitting, Cuff Size: Normal)   Pulse 84   Ht 5' 5.35 (1.66 m)   Wt 206  lb (93.4 kg)   BMI 33.91 kg/m   General: Well developed, well nourished man, seated in exam room, in no evident distress Head: Head normocephalic and atraumatic.  Oropharynx benign. Neck: Supple Cardiovascular: Regular rate and rhythm, no murmurs Respiratory: Breath sounds clear to auscultation Musculoskeletal: No obvious deformities or scoliosis Skin: No rashes or neurocutaneous lesions  Neurologic Exam Mental Status: Awake and fully alert. Has no language Cranial Nerves: Fundoscopic exam reveals sharp disc margins.  Pupils equal, briskly reactive to light.  Turns to localize faces, objects and sounds in the periphery. Face tongue, palate move normally and symmetrically.  Motor: Normal functional bulk, tone and strength Sensory: Withdrawal x 4 Coordination: No dysmetria with reach for objects Gait and Station: Arises from chair without difficulty.  Stance is normal. Gait demonstrates normal stride length and balance.     Impression: Generalized convulsive epilepsy (HCC)  Active autistic disorder  Developmental non-verbal disorder  Intellectual disability  Foster care (status)   Recommendations for plan of care: The patient's previous Epic records were reviewed. No recent diagnostic studies to be reviewed with the patient. I talked with Bryant about  his habit of spitting up the Depakote  at night. I recommended giving the tablet earlier in the evening to allow time for digestion before he lies down for sleep. If that does not help, we may have to return to using Depakote  Sprinkles as the capsules can be opened and consumed with food.   Plan until next visit: Continue medications as prescribed  Call for questions or concerns Return in about 6 months (around 05/23/2024).  The medication list was reviewed and reconciled. No changes were made in the prescribed medications today. A complete medication list was provided to the patient.  Allergies as of 11/21/2023   No Known Allergies       Medication List        Accurate as of November 21, 2023 11:59 PM. If you have any questions, ask your nurse or doctor.          azelastine 0.1 % nasal spray Commonly known as: ASTELIN two sprays by Both Nostrils route 2 (two) times a day as needed for Rhinitis.   cetirizine 10 MG tablet Commonly known as: ZYRTEC Take 10 mg by mouth daily.   Depakote  Sprinkles 125 MG capsule Generic drug: divalproex  TAKE 5 CAPSULES BY MOUTH EVERY MORNING AND 6 CAPSULES BY MOUTH EVERY EVENING   lamoTRIgine  100 MG tablet Commonly known as: LAMICTAL  TAKE 1 TABLET BY MOUTH EVERY MORNING AND TAKE 1 TABLET AT NIGHT   levETIRAcetam  100 MG/ML solution Commonly known as: KEPPRA  TAKE 7.5 ML BY MOUTH TWICE A DAY   melatonin 5 MG Tabs Take by mouth.   multivitamin with minerals tablet Take 1 tablet by mouth daily.   Vitamin D (Ergocalciferol) 1.25 MG (50000 UNIT) Caps capsule Commonly known as: DRISDOL Take 50,000 Units by mouth once a week.      Total time spent with the patient was 25 minutes, of which 50% or more was spent in counseling and coordination of care.  Ellouise Bollman NP-C [facility] [address] [place], [place] [address] Ph. [phone] Fax [phone]

## 2024-03-15 ENCOUNTER — Other Ambulatory Visit (INDEPENDENT_AMBULATORY_CARE_PROVIDER_SITE_OTHER): Payer: Self-pay | Admitting: Family

## 2024-03-15 DIAGNOSIS — G40309 Generalized idiopathic epilepsy and epileptic syndromes, not intractable, without status epilepticus: Secondary | ICD-10-CM

## 2024-03-18 ENCOUNTER — Other Ambulatory Visit (INDEPENDENT_AMBULATORY_CARE_PROVIDER_SITE_OTHER): Payer: Self-pay | Admitting: Family

## 2024-03-18 DIAGNOSIS — G40309 Generalized idiopathic epilepsy and epileptic syndromes, not intractable, without status epilepticus: Secondary | ICD-10-CM

## 2024-05-01 ENCOUNTER — Telehealth (INDEPENDENT_AMBULATORY_CARE_PROVIDER_SITE_OTHER): Payer: Self-pay

## 2024-05-01 NOTE — Telephone Encounter (Signed)
 Received a PA request for Depakote  DR 125 MG  SS, CCMA

## 2024-05-02 ENCOUNTER — Telehealth (INDEPENDENT_AMBULATORY_CARE_PROVIDER_SITE_OTHER): Payer: Self-pay | Admitting: Pharmacy Technician

## 2024-05-02 ENCOUNTER — Other Ambulatory Visit (HOSPITAL_COMMUNITY): Payer: Self-pay

## 2024-05-02 NOTE — Telephone Encounter (Signed)
 Pharmacy Patient Advocate Encounter  Received notification from [employer] that Prior Authorization for Depakote  Sprinkles 125MG  dr sprinkle capsules  has been DENIED.  **No PA needed. Previous PA doesn't expire until 04/09/25**    PA #/Case ID/Reference #: [id number]

## 2024-05-02 NOTE — Telephone Encounter (Signed)
 Pharmacy Patient Advocate Encounter   Received notification from Pt Calls Messages that prior authorization for Depakote  Sprinkles 125MG  dr sprinkle capsules  is required/requested.   Insurance verification completed.   The patient is insured through [employer].   Per test claim: PA required; PA submitted to above mentioned insurance via Latent Key/confirmation #/EOC SARA LEE Status is pending

## 2024-05-02 NOTE — Telephone Encounter (Signed)
 PA request has been Submitted. New Encounter has been or will be created for follow up. For additional info see Pharmacy Prior Auth telephone encounter from 05/02/14.

## 2024-05-14 ENCOUNTER — Telehealth (INDEPENDENT_AMBULATORY_CARE_PROVIDER_SITE_OTHER): Payer: Self-pay | Admitting: Family

## 2024-05-14 NOTE — Telephone Encounter (Signed)
 Mom called and requested to push the appointments back. She is having surgery and is requesting if any medications will be due before the appointment can be filled.   CB# [id number]

## 2024-05-15 NOTE — Telephone Encounter (Signed)
 Please let Mom know that is fine with me. I will send in refills as needed until Jahir and his brother are seen.

## 2024-05-15 NOTE — Telephone Encounter (Signed)
 Attempted to contacted patients mother.  Mother unable to be reached.  LVM to call back.   SS, CCMA

## 2024-05-26 ENCOUNTER — Ambulatory Visit (INDEPENDENT_AMBULATORY_CARE_PROVIDER_SITE_OTHER): Admitting: Family

## 2024-06-16 ENCOUNTER — Ambulatory Visit (INDEPENDENT_AMBULATORY_CARE_PROVIDER_SITE_OTHER): Admitting: Family
